# Patient Record
Sex: Female | Born: 1955
Health system: Southern US, Community
[De-identification: ages and names within clinical notes are randomized; demographics above are authoritative.]

## PROBLEM LIST (undated history)

## (undated) DIAGNOSIS — E8881 Metabolic syndrome: Secondary | ICD-10-CM

## (undated) DIAGNOSIS — E785 Hyperlipidemia, unspecified: Secondary | ICD-10-CM

## (undated) DIAGNOSIS — F1290 Cannabis use, unspecified, uncomplicated: Secondary | ICD-10-CM

## (undated) DIAGNOSIS — R4681 Obsessive-compulsive behavior: Secondary | ICD-10-CM

## (undated) DIAGNOSIS — M1990 Unspecified osteoarthritis, unspecified site: Secondary | ICD-10-CM

## (undated) DIAGNOSIS — R233 Spontaneous ecchymoses: Secondary | ICD-10-CM

## (undated) DIAGNOSIS — R238 Other skin changes: Secondary | ICD-10-CM

## (undated) DIAGNOSIS — R011 Cardiac murmur, unspecified: Secondary | ICD-10-CM

## (undated) DIAGNOSIS — N950 Postmenopausal bleeding: Secondary | ICD-10-CM

## (undated) DIAGNOSIS — E669 Obesity, unspecified: Secondary | ICD-10-CM

## (undated) DIAGNOSIS — R519 Headache, unspecified: Secondary | ICD-10-CM

## (undated) DIAGNOSIS — E119 Type 2 diabetes mellitus without complications: Secondary | ICD-10-CM

## (undated) DIAGNOSIS — H269 Unspecified cataract: Secondary | ICD-10-CM

## (undated) DIAGNOSIS — E039 Hypothyroidism, unspecified: Secondary | ICD-10-CM

## (undated) DIAGNOSIS — R51 Headache: Secondary | ICD-10-CM

## (undated) DIAGNOSIS — R030 Elevated blood-pressure reading, without diagnosis of hypertension: Secondary | ICD-10-CM

## (undated) DIAGNOSIS — F410 Panic disorder [episodic paroxysmal anxiety] without agoraphobia: Secondary | ICD-10-CM

## (undated) HISTORY — DX: Elevated blood-pressure reading, without diagnosis of hypertension: R03.0

## (undated) HISTORY — DX: Metabolic syndrome: E88.81

## (undated) HISTORY — PX: COLONOSCOPY: SHX174

## (undated) HISTORY — DX: Type 2 diabetes mellitus without complications: E11.9

## (undated) HISTORY — DX: Cannabis use, unspecified, uncomplicated: F12.90

## (undated) HISTORY — DX: Unspecified cataract: H26.9

## (undated) HISTORY — DX: Unspecified osteoarthritis, unspecified site: M19.90

## (undated) HISTORY — DX: Metabolic syndrome: E88.810

## (undated) HISTORY — DX: Cardiac murmur, unspecified: R01.1

## (undated) HISTORY — DX: Hyperlipidemia, unspecified: E78.5

## (undated) HISTORY — DX: Postmenopausal bleeding: N95.0

## (undated) HISTORY — PX: NO PAST SURGERIES: SHX2092

## (undated) HISTORY — DX: Panic disorder (episodic paroxysmal anxiety): F41.0

## (undated) HISTORY — DX: Hypothyroidism, unspecified: E03.9

## (undated) HISTORY — DX: Obesity, unspecified: E66.9

---

## 2016-07-26 LAB — HM PAP SMEAR: HM PAP: NORMAL

## 2016-08-10 ENCOUNTER — Ambulatory Visit (INDEPENDENT_AMBULATORY_CARE_PROVIDER_SITE_OTHER): Payer: BLUE CROSS/BLUE SHIELD | Admitting: Family Medicine

## 2016-08-10 ENCOUNTER — Encounter: Payer: Self-pay | Admitting: Family Medicine

## 2016-08-10 VITALS — BP 156/76 | HR 78 | Temp 98.6°F | Ht 62.0 in | Wt 292.5 lb

## 2016-08-10 DIAGNOSIS — N950 Postmenopausal bleeding: Secondary | ICD-10-CM

## 2016-08-10 DIAGNOSIS — E119 Type 2 diabetes mellitus without complications: Secondary | ICD-10-CM | POA: Diagnosis not present

## 2016-08-10 DIAGNOSIS — R011 Cardiac murmur, unspecified: Secondary | ICD-10-CM

## 2016-08-10 DIAGNOSIS — E039 Hypothyroidism, unspecified: Secondary | ICD-10-CM | POA: Diagnosis not present

## 2016-08-10 DIAGNOSIS — E785 Hyperlipidemia, unspecified: Secondary | ICD-10-CM

## 2016-08-10 DIAGNOSIS — IMO0001 Reserved for inherently not codable concepts without codable children: Secondary | ICD-10-CM

## 2016-08-10 DIAGNOSIS — F410 Panic disorder [episodic paroxysmal anxiety] without agoraphobia: Secondary | ICD-10-CM | POA: Diagnosis not present

## 2016-08-10 DIAGNOSIS — R030 Elevated blood-pressure reading, without diagnosis of hypertension: Secondary | ICD-10-CM

## 2016-08-10 DIAGNOSIS — F1290 Cannabis use, unspecified, uncomplicated: Secondary | ICD-10-CM

## 2016-08-10 DIAGNOSIS — M1990 Unspecified osteoarthritis, unspecified site: Secondary | ICD-10-CM

## 2016-08-10 DIAGNOSIS — E8881 Metabolic syndrome: Secondary | ICD-10-CM

## 2016-08-10 DIAGNOSIS — E669 Obesity, unspecified: Secondary | ICD-10-CM

## 2016-08-10 DIAGNOSIS — Z0000 Encounter for general adult medical examination without abnormal findings: Secondary | ICD-10-CM

## 2016-08-10 MED ORDER — ALPRAZOLAM 1 MG PO TABS: 0.5000 mg | ORAL_TABLET | Freq: Every day | ORAL | 0 refills | Status: DC | PRN

## 2016-08-10 MED ORDER — LEVOTHYROXINE SODIUM 25 MCG PO TABS: 25.0000 ug | ORAL_TABLET | Freq: Every day | ORAL | Status: DC

## 2016-08-10 NOTE — Patient Instructions (Addendum)
Go to the lab on the way out.  We'll contact you with your lab report. Start levothyroid a day.  Recheck TSH in about 2 months.   Take care.  Glad to see you.  Read up on diabetes.org.  Look up Type 2.   We'll be in touch and make some plans after I see your labs.   Take care.  Glad to see you.

## 2016-08-10 NOTE — Progress Notes (Signed)
New patient.    OA improved on tumeric.  This is a chronic issue for patient, per her report.    Hypothyroidism.  Prev on armour/levothyroxine.  Off med for years.  TSH 19 in 04/2016, [facility].    H/o elevated BP at MD visits but not o/w per patient.  Not on meds o/w.    DM2.  Outside A1c 6.6 reviewed.  D/w pt about DM dx and path phys.  She didn't realize she was diabetic.   HLD.  Noted on outside labs, reviewed.    Panic sx.  Had been on xanax prn over the years.  Not taken daily.  Has gone months w/o need for med.    H/o VB, had f/u with GYN, uterine polyps noted.  F/u pending with possible removal.  She hasn't had a biopsy yet.    Pap and mammogram done per GYN recently.  Outside notes reviewed re: pap.   Ongoing episodic marijuana use.  D/w pt about cessation and finding other ways to manage her anxiety, etc.   Obese, d/w pt about weight loss.   PMH and SH reviewed  ROS: Per HPI unless specifically indicated in ROS section   Meds, vitals, and allergies reviewed.   GEN: nad, alert and oriented, obese HEENT: mucous membranes moist NECK: supple w/o LA, no tmg CV: rrr.  Soft systolic murmur noted at L upper sternal border PULM: ctab, no inc wob ABD: soft, +bs EXT: no edema SKIN: no acute rash

## 2016-08-10 NOTE — Progress Notes (Signed)
Pre visit review using our clinic review tool, if applicable. No additional management support is needed unless otherwise documented below in the visit note. 

## 2016-08-11 ENCOUNTER — Encounter: Payer: Self-pay | Admitting: Family Medicine

## 2016-08-11 DIAGNOSIS — E8881 Metabolic syndrome: Secondary | ICD-10-CM | POA: Insufficient documentation

## 2016-08-11 DIAGNOSIS — N950 Postmenopausal bleeding: Secondary | ICD-10-CM | POA: Insufficient documentation

## 2016-08-11 DIAGNOSIS — Z0000 Encounter for general adult medical examination without abnormal findings: Secondary | ICD-10-CM | POA: Insufficient documentation

## 2016-08-11 DIAGNOSIS — E669 Obesity, unspecified: Secondary | ICD-10-CM | POA: Insufficient documentation

## 2016-08-11 DIAGNOSIS — R011 Cardiac murmur, unspecified: Secondary | ICD-10-CM | POA: Insufficient documentation

## 2016-08-11 DIAGNOSIS — R030 Elevated blood-pressure reading, without diagnosis of hypertension: Secondary | ICD-10-CM | POA: Insufficient documentation

## 2016-08-11 DIAGNOSIS — E119 Type 2 diabetes mellitus without complications: Secondary | ICD-10-CM | POA: Insufficient documentation

## 2016-08-11 DIAGNOSIS — F1290 Cannabis use, unspecified, uncomplicated: Secondary | ICD-10-CM | POA: Insufficient documentation

## 2016-08-11 DIAGNOSIS — F410 Panic disorder [episodic paroxysmal anxiety] without agoraphobia: Secondary | ICD-10-CM | POA: Insufficient documentation

## 2016-08-11 DIAGNOSIS — E039 Hypothyroidism, unspecified: Secondary | ICD-10-CM | POA: Insufficient documentation

## 2016-08-11 DIAGNOSIS — E785 Hyperlipidemia, unspecified: Secondary | ICD-10-CM | POA: Insufficient documentation

## 2016-08-11 DIAGNOSIS — M1990 Unspecified osteoarthritis, unspecified site: Secondary | ICD-10-CM | POA: Insufficient documentation

## 2016-08-11 LAB — TSH: TSH: 13.62 u[IU]/mL — AB (ref 0.35–4.50)

## 2016-08-11 LAB — HEMOGLOBIN A1C: HEMOGLOBIN A1C: 6.8 % — AB (ref 4.6–6.5)

## 2016-08-11 NOTE — Assessment & Plan Note (Signed)
Lifelong per patient report. We can follow this clinically. She does not appear to need an echo at this point.

## 2016-08-11 NOTE — Assessment & Plan Note (Addendum)
A1c 6.6 previously. She is diabetic. Discussed with patient about pathophysiology of diabetes. She said she wasn't even aware that she was diabetic. We talked about diet and exercise and weight loss. Recheck A1c pending. We will make plans when I see her follow-up A1c. >45 minutes spent in face to face time with patient, >50% spent in counselling or coordination of care.

## 2016-08-11 NOTE — Assessment & Plan Note (Signed)
She says that she has whitecoat hypertension and her blood pressure is controlled out of clinic. We can follow this episodically. Still needs weight loss.

## 2016-08-11 NOTE — Assessment & Plan Note (Signed)
Discussed with patient. Cessation encouraged. She needs to stop use. See discussion of panic.

## 2016-08-11 NOTE — Assessment & Plan Note (Signed)
She has uterine biopsy pending. I will defer to gynecology.

## 2016-08-11 NOTE — Assessment & Plan Note (Signed)
Noted on outside labs. We can recheck this again later on. Need to work on diet and exercise.

## 2016-08-11 NOTE — Assessment & Plan Note (Signed)
Likely exacerbated by her weight. She takes tumeric for this.

## 2016-08-11 NOTE — Assessment & Plan Note (Signed)
Needs diet and exercise.

## 2016-08-11 NOTE — Assessment & Plan Note (Signed)
She has been told she had a thyroid problem but didn't really understand what was going on. I told her about the pathophysiology of hypothyroidism. Needs to start 25 g levothyroxine daily. Routine instructions given. Recheck TSH in about 2 months. She only had a prescription for the medication, but had not started yet. All questions answered.

## 2016-08-11 NOTE — Assessment & Plan Note (Signed)
She is occasionally used Xanax on a when necessary basis. Discussed with patient. Not a good long term medication. She has an upcoming uterine biopsy and she is really anxious about that. I gave her short course of Xanax, #15, to use on a when necessary basis to get through this procedure. I told her about routine control medicine monitoring and future drug testing. She needs to decide how she was to manage her anxiety. There is no expectation for me to continue to prescribe controlled medicines if she is going to use illicit drugs.  I talked with her about other preventive medications that are legal. This is her chance to stop using marijuana and decide how she wants to handle her panic symptoms.

## 2016-08-11 NOTE — Assessment & Plan Note (Signed)
Discussed with patient about her health in general. "I thought I was healthy." I told her about the fact that she has multiple overlapping chronic illnesses that need treatment.   We talked about health maintenance in general.  She told me that she did not believe in vaccinations. I told her that her beliefs about vaccination didn't matter to me.  What really matters to me is giving her an opportunity to get access to good healthcare. She is going to have to decide if she is willing to engage in that. In her case she has multiple indications and no contraindications.

## 2016-08-11 NOTE — Assessment & Plan Note (Signed)
Needs diet and exercise. 

## 2016-08-12 ENCOUNTER — Encounter: Payer: Self-pay | Admitting: Family Medicine

## 2016-10-05 ENCOUNTER — Other Ambulatory Visit: Payer: Self-pay | Admitting: Obstetrics and Gynecology

## 2016-10-08 NOTE — Patient Instructions (Signed)
Your procedure is scheduled on:  Tuesday, Jan. 2, 2017  Enter through the Main Entrance of [facility] at:  11:30 AM  Pick up the phone at the desk and dial [phone].  Call this number if you have problems the morning of surgery: [phone].  Remember: Do NOT eat food:  After Midnight Monday, Jan. 1, 2017  Do NOT drink clear liquids after:  9:00 AM day of surgery  Take these medicines the morning of surgery with a SIP OF WATER:  Levothyroxine, Xanax if needed  Stop ALL herbal medications and turmeric at this time   Do NOT wear jewelry (body piercing), metal hair clips/bobby pins, make-up, or nail polish. Do NOT wear lotions, powders, or perfumes.  You may wear deodorant. Do NOT shave for 48 hours prior to surgery. Do NOT bring valuables to the hospital. Contacts, dentures, or bridgework may not be worn into surgery.  Have a responsible adult drive you home and stay with you for 24 hours after your procedure

## 2016-10-12 ENCOUNTER — Encounter (HOSPITAL_COMMUNITY): Payer: Self-pay

## 2016-10-12 ENCOUNTER — Other Ambulatory Visit: Payer: Self-pay

## 2016-10-12 ENCOUNTER — Encounter (HOSPITAL_COMMUNITY)
Admission: RE | Admit: 2016-10-12 | Discharge: 2016-10-12 | Disposition: A | Discharge status: Home / Self Care | Payer: BLUE CROSS/BLUE SHIELD | Source: Ambulatory Visit | Attending: Obstetrics and Gynecology | Admitting: Obstetrics and Gynecology

## 2016-10-12 DIAGNOSIS — Z01810 Encounter for preprocedural cardiovascular examination: Secondary | ICD-10-CM | POA: Diagnosis present

## 2016-10-12 DIAGNOSIS — Q248 Other specified congenital malformations of heart: Secondary | ICD-10-CM | POA: Diagnosis not present

## 2016-10-12 DIAGNOSIS — E785 Hyperlipidemia, unspecified: Secondary | ICD-10-CM | POA: Insufficient documentation

## 2016-10-12 DIAGNOSIS — M1990 Unspecified osteoarthritis, unspecified site: Secondary | ICD-10-CM | POA: Insufficient documentation

## 2016-10-12 DIAGNOSIS — R011 Cardiac murmur, unspecified: Secondary | ICD-10-CM | POA: Diagnosis not present

## 2016-10-12 DIAGNOSIS — Z01812 Encounter for preprocedural laboratory examination: Secondary | ICD-10-CM | POA: Diagnosis not present

## 2016-10-12 DIAGNOSIS — R030 Elevated blood-pressure reading, without diagnosis of hypertension: Secondary | ICD-10-CM | POA: Insufficient documentation

## 2016-10-12 DIAGNOSIS — E8881 Metabolic syndrome: Secondary | ICD-10-CM | POA: Insufficient documentation

## 2016-10-12 DIAGNOSIS — E039 Hypothyroidism, unspecified: Secondary | ICD-10-CM | POA: Diagnosis not present

## 2016-10-12 DIAGNOSIS — E669 Obesity, unspecified: Secondary | ICD-10-CM | POA: Insufficient documentation

## 2016-10-12 DIAGNOSIS — R7303 Prediabetes: Secondary | ICD-10-CM | POA: Diagnosis not present

## 2016-10-12 DIAGNOSIS — F1290 Cannabis use, unspecified, uncomplicated: Secondary | ICD-10-CM | POA: Insufficient documentation

## 2016-10-12 DIAGNOSIS — F410 Panic disorder [episodic paroxysmal anxiety] without agoraphobia: Secondary | ICD-10-CM | POA: Insufficient documentation

## 2016-10-12 DIAGNOSIS — N950 Postmenopausal bleeding: Secondary | ICD-10-CM | POA: Diagnosis not present

## 2016-10-12 HISTORY — DX: Obsessive-compulsive behavior: R46.81

## 2016-10-12 HISTORY — DX: Headache: R51

## 2016-10-12 HISTORY — DX: Other skin changes: R23.8

## 2016-10-12 HISTORY — DX: Headache, unspecified: R51.9

## 2016-10-12 HISTORY — DX: Spontaneous ecchymoses: R23.3

## 2016-10-12 LAB — CBC
HCT: 45.7 % (ref 36.0–46.0)
Hemoglobin: 15.3 g/dL — ABNORMAL HIGH (ref 12.0–15.0)
MCH: 33 pg (ref 26.0–34.0)
MCHC: 33.5 g/dL (ref 30.0–36.0)
MCV: 98.7 fL (ref 78.0–100.0)
Platelets: 221 10*3/uL (ref 150–400)
RBC: 4.63 MIL/uL (ref 3.87–5.11)
RDW: 14.4 % (ref 11.5–15.5)
WBC: 7.6 10*3/uL (ref 4.0–10.5)

## 2016-10-19 ENCOUNTER — Encounter (HOSPITAL_COMMUNITY): Payer: Self-pay

## 2016-10-19 ENCOUNTER — Ambulatory Visit (HOSPITAL_COMMUNITY)
Admission: RE | Admit: 2016-10-19 | Discharge: 2016-10-19 | Disposition: A | Discharge status: Home / Self Care | Payer: BLUE CROSS/BLUE SHIELD | Source: Ambulatory Visit | Attending: Obstetrics and Gynecology | Admitting: Obstetrics and Gynecology

## 2016-10-19 ENCOUNTER — Ambulatory Visit (HOSPITAL_COMMUNITY): Payer: BLUE CROSS/BLUE SHIELD | Admitting: Anesthesiology

## 2016-10-19 ENCOUNTER — Encounter (HOSPITAL_COMMUNITY)
Admission: RE | Disposition: A | Discharge status: Home / Self Care | Payer: Self-pay | Source: Ambulatory Visit | Attending: Obstetrics and Gynecology

## 2016-10-19 ENCOUNTER — Other Ambulatory Visit: Payer: BLUE CROSS/BLUE SHIELD

## 2016-10-19 DIAGNOSIS — E669 Obesity, unspecified: Secondary | ICD-10-CM | POA: Insufficient documentation

## 2016-10-19 DIAGNOSIS — N841 Polyp of cervix uteri: Secondary | ICD-10-CM | POA: Diagnosis not present

## 2016-10-19 DIAGNOSIS — N939 Abnormal uterine and vaginal bleeding, unspecified: Secondary | ICD-10-CM | POA: Insufficient documentation

## 2016-10-19 DIAGNOSIS — E785 Hyperlipidemia, unspecified: Secondary | ICD-10-CM | POA: Diagnosis not present

## 2016-10-19 DIAGNOSIS — R7303 Prediabetes: Secondary | ICD-10-CM | POA: Diagnosis not present

## 2016-10-19 DIAGNOSIS — Z79899 Other long term (current) drug therapy: Secondary | ICD-10-CM | POA: Diagnosis not present

## 2016-10-19 DIAGNOSIS — E039 Hypothyroidism, unspecified: Secondary | ICD-10-CM | POA: Diagnosis not present

## 2016-10-19 HISTORY — PX: DILATATION & CURETTAGE/HYSTEROSCOPY WITH MYOSURE: SHX6511

## 2016-10-19 SURGERY — DILATATION & CURETTAGE/HYSTEROSCOPY WITH MYOSURE
Anesthesia: Spinal

## 2016-10-19 MED ORDER — ONDANSETRON HCL 4 MG/2ML IJ SOLN
INTRAMUSCULAR | Status: AC
  Filled 2016-10-19: qty 2

## 2016-10-19 MED ORDER — LACTATED RINGERS IV SOLN
INTRAVENOUS | Status: DC
  Administered 2016-10-19: 125 mL/h via INTRAVENOUS
  Administered 2016-10-19: 14:00:00 via INTRAVENOUS

## 2016-10-19 MED ORDER — ONDANSETRON HCL 4 MG/2ML IJ SOLN
INTRAMUSCULAR | Status: DC | PRN
  Administered 2016-10-19: 4 mg via INTRAVENOUS

## 2016-10-19 MED ORDER — SODIUM CHLORIDE 0.9 % IJ SOLN
INTRAMUSCULAR | Status: AC
  Filled 2016-10-19: qty 10

## 2016-10-19 MED ORDER — ACETAMINOPHEN 160 MG/5ML PO SOLN
975.0000 mg | Freq: Once | ORAL | Status: AC
  Administered 2016-10-19: 975 mg via ORAL

## 2016-10-19 MED ORDER — VASOPRESSIN 20 UNIT/ML IV SOLN
INTRAVENOUS | Status: AC
  Filled 2016-10-19: qty 1

## 2016-10-19 MED ORDER — SODIUM CHLORIDE 0.9 % IJ SOLN: INTRAMUSCULAR | Status: DC | PRN

## 2016-10-19 MED ORDER — SCOPOLAMINE 1 MG/3DAYS TD PT72
1.0000 | MEDICATED_PATCH | TRANSDERMAL | Status: DC
  Administered 2016-10-19: 1.5 mg via TRANSDERMAL

## 2016-10-19 MED ORDER — BUPIVACAINE HCL (PF) 0.25 % IJ SOLN
INTRAMUSCULAR | Status: DC | PRN
  Administered 2016-10-19: 20 mL

## 2016-10-19 MED ORDER — DEXTROSE 5 % IV SOLN
3.0000 g | INTRAVENOUS | Status: DC
  Filled 2016-10-19: qty 3000

## 2016-10-19 MED ORDER — SODIUM CHLORIDE 0.9 % IR SOLN
Status: DC | PRN
  Administered 2016-10-19: 2487 mL

## 2016-10-19 MED ORDER — BUPIVACAINE HCL (PF) 0.25 % IJ SOLN
INTRAMUSCULAR | Status: AC
  Filled 2016-10-19: qty 30

## 2016-10-19 MED ORDER — SODIUM CHLORIDE 0.9 % IJ SOLN
INTRAMUSCULAR | Status: AC
  Filled 2016-10-19: qty 40

## 2016-10-19 MED ORDER — FENTANYL CITRATE (PF) 100 MCG/2ML IJ SOLN
INTRAMUSCULAR | Status: AC
  Filled 2016-10-19: qty 2

## 2016-10-19 MED ORDER — SCOPOLAMINE 1 MG/3DAYS TD PT72
MEDICATED_PATCH | TRANSDERMAL | Status: DC
Start: 2016-10-19 — End: 2016-10-19
  Administered 2016-10-19: 1.5 mg via TRANSDERMAL
  Filled 2016-10-19: qty 1

## 2016-10-19 MED ORDER — ACETAMINOPHEN 160 MG/5ML PO SOLN
ORAL | Status: AC
  Administered 2016-10-19: 975 mg via ORAL
  Filled 2016-10-19: qty 40.6

## 2016-10-19 MED ORDER — DEXTROSE 5 % IV SOLN
INTRAVENOUS | Status: DC | PRN
  Administered 2016-10-19: 3 g via INTRAVENOUS

## 2016-10-19 MED ORDER — VASOPRESSIN 20 UNIT/ML IV SOLN
INTRAVENOUS | Status: DC | PRN
  Administered 2016-10-19: 20 mL via INTRAMUSCULAR

## 2016-10-19 MED ORDER — CEFAZOLIN SODIUM-DEXTROSE 2-4 GM/100ML-% IV SOLN: 2.0000 g | INTRAVENOUS | Status: DC

## 2016-10-19 MED ORDER — TRAMADOL HCL 50 MG PO TABS: 50.0000 mg | ORAL_TABLET | Freq: Four times a day (QID) | ORAL | 0 refills | Status: DC | PRN

## 2016-10-19 SURGICAL SUPPLY — 25 items
CANISTER SUCT 3000ML (MISCELLANEOUS) ×2 IMPLANT
CATH FOLEY 2WAY  3CC  8FR (CATHETERS) ×1
CATH FOLEY 2WAY 3CC 8FR (CATHETERS) ×1 IMPLANT
CATH ROBINSON RED A/P 16FR (CATHETERS) IMPLANT
CLOTH BEACON ORANGE TIMEOUT ST (SAFETY) ×2 IMPLANT
CONTAINER PREFILL 10% NBF 60ML (FORM) ×4 IMPLANT
DECANTER SPIKE VIAL GLASS SM (MISCELLANEOUS) ×4 IMPLANT
DEVICE MYOSURE LITE (MISCELLANEOUS) ×2 IMPLANT
DEVICE MYOSURE REACH (MISCELLANEOUS) IMPLANT
FILTER ARTHROSCOPY CONVERTOR (FILTER) ×2 IMPLANT
GLOVE BIO SURGEON STRL SZ7.5 (GLOVE) ×2 IMPLANT
GLOVE BIOGEL PI IND STRL 7.0 (GLOVE) ×1 IMPLANT
GLOVE BIOGEL PI INDICATOR 7.0 (GLOVE) ×1
GOWN STRL REUS W/TWL LRG LVL3 (GOWN DISPOSABLE) ×4 IMPLANT
NEEDLE SPNL 22GX3.5 QUINCKE BK (NEEDLE) ×2 IMPLANT
PACK VAGINAL MINOR WOMEN LF (CUSTOM PROCEDURE TRAY) ×2 IMPLANT
PAD OB MATERNITY 4.3X12.25 (PERSONAL CARE ITEMS) ×2 IMPLANT
SEAL ROD LENS SCOPE MYOSURE (ABLATOR) ×2 IMPLANT
SYR CONTROL 10ML LL (SYRINGE) ×2 IMPLANT
SYR TB 1ML 25GX5/8 (SYRINGE) ×2 IMPLANT
TOWEL OR 17X24 6PK STRL BLUE (TOWEL DISPOSABLE) ×4 IMPLANT
TRAY FOLEY CATH SILVER 14FR (SET/KITS/TRAYS/PACK) ×2 IMPLANT
TUBING AQUILEX INFLOW (TUBING) ×2 IMPLANT
TUBING AQUILEX OUTFLOW (TUBING) ×2 IMPLANT
WATER STERILE IRR 1000ML POUR (IV SOLUTION) ×2 IMPLANT

## 2016-10-19 NOTE — Op Note (Signed)
10/19/2016  2:15 PM  PATIENT:  Cassidy Russell  61 y.o. female  PRE-OPERATIVE DIAGNOSIS:  Abnormal Uterine Bleeding-PMB  POST-OPERATIVE DIAGNOSIS:  Abnormal Uterine Bleeding-PMB  Large endocervical polyp  PROCEDURE:  Procedure(s): DILATATION & CURETTAGE/HYSTEROSCOPY WITH MYOSURE PLACEMENT OF INTRAUTERINE FOLEY CATHETER  SURGEON:  Surgeon(s): Olivia Mackieichard Natalia Wittmeyer, MD  ASSISTANTS: none   ANESTHESIA:   local and spinal  ESTIMATED BLOOD LOSS: MINIMAL  DRAINS: Urinary Catheter (Foley)   LOCAL MEDICATIONS USED:  MARCAINE    and Amount: 20 ml  SPECIMEN:  Source of Specimen:  EMC, POLYP  DISPOSITION OF SPECIMEN:  PATHOLOGY  COUNTS:  YES  DICTATION #: [id number]  PLAN OF CARE: DC HOME  PATIENT DISPOSITION:  PACU - hemodynamically stable.

## 2016-10-19 NOTE — Op Note (Signed)
NAMLetitia Russell:  Cassidy Russell, Cassidy Russell                   ACCOUNT NO.:  [id number]  MEDICAL RECORD NO.:  [id number]  LOCATION:                                 FACILITY:  PHYSICIAN:  Lenoard Adenichard J. Corde Antonini, M.D.     DATE OF BIRTH:  DATE OF PROCEDURE:  10/19/2016 DATE OF DISCHARGE:                              OPERATIVE REPORT   PREOPERATIVE DIAGNOSIS:  Postmenopausal bleeding.  POSTOPERATIVE DIAGNOSIS:  Postmenopausal bleeding with a large endocervical polyp.  PROCEDURE:  Diagnostic hysteroscopy, dilation and curettage, MyoSure with placement of intrauterine pediatric Foley catheter for bleeding from polyp removal site.  SURGEON:  Lenoard Adenichard J. Mishael Haran, M.D.  ASSISTANT:  None.  ANESTHESIA:  Spinal and local.  ESTIMATED BLOOD LOSS:  Less than 50 mL.  COMPLICATIONS:  None.  DRAINS:  A pediatric Foley intrauterine and bladder catheter.  COUNTS:  Correct.  DISPOSITION:  The patient to recovery in good condition.  BRIEF OPERATIVE NOTE:  After being apprised of risks of anesthesia, infection, bleeding in surrounding organs, possible need for repair, delayed versus immediate complications to include bowel and bladder injury, possible need for repair, the patient was brought to the operating room where she was administered spinal anesthetic without complications, prepped and draped in usual sterile fashion, and Foley catheter placed.  Exam under anesthesia reveals an anteflexed uterus with no adnexal masses.  At this time, the speculum was placed, dilute Marcaine solution placed in standard paracervical block, dilute Pitressin solution placed at 3 and 9 o'clock.  A hysteroscope was placed after dilatation up to a #23 Pratt dilator.  Visualization revealed an endocervical polyp or what appears to be a long elongated endocervical polyp which was obstructing the view to the uterus due to its occupance of the entire endocervical canal.  The MyoSure was then placed and used to resect this polyp in total up to  its level of attachment at the internal os.  There was some evidence of bleeding from the site, which was difficult to visualize.  The endometrial curettings were also collected using sharp curettage in a 4-quadrant method and sent for pathological confirmation.  Due to a steady trickle of blood without heavy bleeding, pediatric Foley was placed without difficulty.  At this point, minimal bleeding was noted. Pediatric Foley will be removed in the recovery room.  The patient tolerated the procedure well and was transferred to recovery in good condition.  Specimen to pathology.  The patient to recovery in good condition.     Lenoard Adenichard J. Darica Goren, M.D.     RJT/MEDQ  D:  10/19/2016  T:  10/19/2016  Job:  [id number]

## 2016-10-19 NOTE — Anesthesia Preprocedure Evaluation (Addendum)
Anesthesia Evaluation  Patient identified by MRN, date of birth, ID band Patient awake    Reviewed: Allergy & Precautions, H&P , Patient's Chart, lab work & pertinent test results  Airway Mallampati: II  TM Distance: >3 FB Neck ROM: full    Dental no notable dental hx.    Pulmonary    Pulmonary exam normal breath sounds clear to auscultation       Cardiovascular Exercise Tolerance: Good  Rhythm:regular Rate:Normal     Neuro/Psych    GI/Hepatic   Endo/Other  Morbid obesity  Renal/GU      Musculoskeletal   Abdominal   Peds  Hematology   Anesthesia Other Findings No previoius hx of GA or regional Will use LA only; NV will not be an aissue  Reproductive/Obstetrics                            Anesthesia Physical Anesthesia Plan  ASA: III  Anesthesia Plan: Spinal   Post-op Pain Management:    Induction:   Airway Management Planned:   Additional Equipment:   Intra-op Plan:   Post-operative Plan:   Informed Consent: I have reviewed the patients History and Physical, chart, labs and discussed the procedure including the risks, benefits and alternatives for the proposed anesthesia with the patient or authorized representative who has indicated his/her understanding and acceptance.   Dental Advisory Given  Plan Discussed with: CRNA  Anesthesia Plan Comments: (Lab work and procedure  confirmed with CRNA in room; platelets okay. Discussed spinal anesthetic, and patient consents to the procedure:  included risk of possible headache,backache, failed block, allergic reaction, and nerve injury. This patient was asked if she had any questions or concerns before the procedure started.  )        Anesthesia Quick Evaluation

## 2016-10-19 NOTE — Transfer of Care (Signed)
Immediate Anesthesia Transfer of Care Note  Patient: Cassidy Russell  Procedure(s) Performed: Procedure(s): DILATATION & CURETTAGE/HYSTEROSCOPY WITH MYOSURE (N/A)  Patient Location: PACU  Anesthesia Type:Spinal  Level of Consciousness: awake, alert , oriented and patient cooperative  Airway & Oxygen Therapy: Patient Spontanous Breathing  Post-op Assessment: Report given to RN and Post -op Vital signs reviewed and stable  Post vital signs: Reviewed and stable  Last Vitals:  Vitals:   10/19/16 1414 10/19/16 1415  BP: (!) 151/69 (!) 151/69  Pulse: (!) 55 (!) 58  Resp: 12 12  Temp: 36.8 C     Last Pain:  Vitals:   10/19/16 1202  TempSrc: Oral      Patients Stated Pain Goal: 4 (10/19/16 1202)  Complications: No apparent anesthesia complications

## 2016-10-19 NOTE — H&P (Signed)
Cassidy Russell is an 61 y.o. female for PMB  Pertinent Gynecological History: Menses: post-menopausal Bleeding: post menopausal bleeding Contraception: none DES exposure: denies Blood transfusions: none Sexually transmitted diseases: no past history Previous GYN Procedures: DNC  Last mammogram: normal Date: 2017 Last pap: normal Date: 2017 OB History: G1, P0   Menstrual History: Menarche age: [age] No LMP recorded. Patient is postmenopausal.    Past Medical History:  Diagnosis Date  . Arthritis   . Bruises easily   . Elevated BP without diagnosis of hypertension   . Headache    Migraines  . Heart murmur    lifelong per patient report  . Hyperlipidemia   . Hypothyroidism   . Marijuana use   . Metabolic syndrome   . Obesity   . Obsessive-compulsive behavior   . Panic   . Postmenopausal vaginal bleeding   . Pre-diabetes     Past Surgical History:  Procedure Laterality Date  . COLONOSCOPY    . NO PAST SURGERIES      Family History  Problem Relation Age of Onset  . Adopted: Yes    Social History:  reports that she has never smoked. She has never used smokeless tobacco. She reports that she drinks about 1.2 oz of alcohol per week . She reports that she uses drugs, including Marijuana.  Allergies:  Allergies  Allergen Reactions  . Iodine Anaphylaxis and Itching  . Shellfish Allergy Anaphylaxis and Itching  . Barium-Containing Compounds     Prescriptions Prior to Admission  Medication Sig Dispense Refill Last Dose  . ALPRAZolam (XANAX) 1 MG tablet Take 0.5 tablets (0.5 mg total) by mouth daily as needed for anxiety. 15 tablet 0 10/18/2016 at 1000  . levothyroxine (SYNTHROID, LEVOTHROID) 25 MCG tablet Take 1 tablet (25 mcg total) by mouth daily before breakfast.   Past Week at unknown  . TURMERIC PO Take 5 mLs by mouth at bedtime. Pt mixes with 1/4 teaspoon of cinnamon, a cup and a half of soy milk, and 1/2 teaspoon of black pepper.   Past Month at unknown    Review of  Systems  Constitutional: Negative.   All other systems reviewed and are negative.   Blood pressure (!) 147/76, pulse 60, temperature 98.6 F (37 C), temperature source Oral, resp. rate 16, SpO2 98 %. Physical Exam  Nursing note and vitals reviewed. Constitutional: She is oriented to person, place, and time. She appears well-developed and well-nourished.  HENT:  Head: Normocephalic and atraumatic.  Neck: Normal range of motion. Neck supple. No thyromegaly present.  Cardiovascular: Normal rate and regular rhythm.   Respiratory: Effort normal and breath sounds normal.  GI: Bowel sounds are normal.  Genitourinary: Vagina normal and uterus normal.  Musculoskeletal: Normal range of motion.  Neurological: She is alert and oriented to person, place, and time.  Skin: Skin is warm and dry.  Psychiatric: She has a normal mood and affect.    No results found for this or any previous visit (from the past 24 hour(s)).  No results found.  Assessment/Plan: PMB Structural lesion Diag HS, D&C with Myosure Consent done.  Khyle Goodell J 10/19/2016, 1:02 PM

## 2016-10-19 NOTE — Discharge Instructions (Signed)

## 2016-10-19 NOTE — Progress Notes (Signed)
Patient seen and examined. Consent witnessed and signed. No changes noted. Update completed.Patient ID: Cassidy KaufmannKay Fiorella, female   DOB: [birth year], 61 y.o.   MRN: [id number]

## 2016-10-19 NOTE — Anesthesia Postprocedure Evaluation (Signed)
Anesthesia Post Note  Patient: Cassidy Russell  Procedure(s) Performed: Procedure(s) (LRB): DILATATION & CURETTAGE/HYSTEROSCOPY WITH MYOSURE (N/A)  Patient location during evaluation: PACU Anesthesia Type: Spinal Level of consciousness: awake Pain management: satisfactory to patient Vital Signs Assessment: post-procedure vital signs reviewed and stable Respiratory status: spontaneous breathing Cardiovascular status: blood pressure returned to baseline Postop Assessment: no headache and spinal receding Anesthetic complications: no        Last Vitals:  Vitals:   10/19/16 1700 10/19/16 1715  BP: 136/71 129/72  Pulse: (!) 56 (!) 56  Resp: 18 13  Temp:  36.7 C    Last Pain:  Vitals:   10/19/16 1735  TempSrc:   PainSc: 0-No pain   Pain Goal: Patients Stated Pain Goal: 4 (10/19/16 1715)               Cristela BlueJACKSON,Jenese Mischke EDWARD

## 2016-10-19 NOTE — Anesthesia Procedure Notes (Signed)
Spinal  Patient location during procedure: OR Staffing Anesthesiologist: Cristela BlueJACKSON, Lanice Folden Preanesthetic Checklist Completed: patient identified, site marked, surgical consent, pre-op evaluation, timeout performed, IV checked, risks and benefits discussed and monitors and equipment checked Spinal Block Patient position: sitting Prep: DuraPrep Patient monitoring: heart rate, cardiac monitor, continuous pulse ox and blood pressure Approach: midline Location: L3-4 Injection technique: single-shot Needle Needle type: Sprotte  Needle gauge: 24 G Needle length: 12.7 cm Assessment Sensory level: T4 Additional Notes LOR with 17 GA Tuohy at 9cm Sprotte thru touhy 1.2 cc Bupiv .75%

## 2016-10-20 ENCOUNTER — Encounter (HOSPITAL_COMMUNITY): Payer: Self-pay | Admitting: Obstetrics and Gynecology

## 2016-10-20 NOTE — Addendum Note (Signed)
Addendum  created 10/20/16 0749 by Algis GreenhouseLinda A Lillar Bianca, CRNA   Charge Capture section accepted

## 2016-10-20 NOTE — Addendum Note (Signed)
Addendum  created 10/20/16 [id number] by Algis GreenhouseLinda A Helton Oleson, CRNA   Charge Capture section accepted

## 2016-12-14 ENCOUNTER — Ambulatory Visit: Payer: BLUE CROSS/BLUE SHIELD | Admitting: Family Medicine

## 2017-04-11 ENCOUNTER — Other Ambulatory Visit: Payer: Self-pay | Admitting: Family Medicine

## 2017-04-11 NOTE — Telephone Encounter (Signed)
Electronic refill request. Alprazolam Last office visit:   08/10/16 Last Filled:    15 tablet 0 08/10/2016  Please advise.

## 2017-04-12 NOTE — Telephone Encounter (Signed)
1. Needs f/u labs re: hypothyroidism and DM2.  2. Would need OV scheduled thereafter.  3. Needs UDS done before any controlled meds are filled.   I didn't fill rx yet.   Thanks.

## 2017-04-12 NOTE — Telephone Encounter (Signed)
Patient requested appt ASAP in order to get her medication filled.  Appt scheduled with same day labs and UDS.

## 2017-04-14 ENCOUNTER — Ambulatory Visit (INDEPENDENT_AMBULATORY_CARE_PROVIDER_SITE_OTHER): Payer: BLUE CROSS/BLUE SHIELD | Admitting: Family Medicine

## 2017-04-14 ENCOUNTER — Encounter: Payer: Self-pay | Admitting: Family Medicine

## 2017-04-14 VITALS — BP 148/92 | HR 84 | Temp 98.5°F | Wt 289.5 lb

## 2017-04-14 DIAGNOSIS — IMO0001 Reserved for inherently not codable concepts without codable children: Secondary | ICD-10-CM

## 2017-04-14 DIAGNOSIS — E669 Obesity, unspecified: Secondary | ICD-10-CM

## 2017-04-14 DIAGNOSIS — Z0000 Encounter for general adult medical examination without abnormal findings: Secondary | ICD-10-CM

## 2017-04-14 DIAGNOSIS — R030 Elevated blood-pressure reading, without diagnosis of hypertension: Secondary | ICD-10-CM | POA: Diagnosis not present

## 2017-04-14 DIAGNOSIS — E039 Hypothyroidism, unspecified: Secondary | ICD-10-CM | POA: Diagnosis not present

## 2017-04-14 DIAGNOSIS — E119 Type 2 diabetes mellitus without complications: Secondary | ICD-10-CM | POA: Diagnosis not present

## 2017-04-14 DIAGNOSIS — F410 Panic disorder [episodic paroxysmal anxiety] without agoraphobia: Secondary | ICD-10-CM

## 2017-04-14 DIAGNOSIS — R221 Localized swelling, mass and lump, neck: Secondary | ICD-10-CM | POA: Diagnosis not present

## 2017-04-14 LAB — LIPID PANEL
Cholesterol: 258 mg/dL — ABNORMAL HIGH (ref 0–200)
HDL: 64.4 mg/dL (ref >39.00)
NonHDL: 193.34
Total CHOL/HDL Ratio: 4
Triglycerides: 225 mg/dL — ABNORMAL HIGH (ref 0.0–149.0)
VLDL: 45 mg/dL — AB (ref 0.0–40.0)

## 2017-04-14 LAB — COMPREHENSIVE METABOLIC PANEL
ALBUMIN: 4 g/dL (ref 3.5–5.2)
ALK PHOS: 58 U/L (ref 39–117)
ALT: 26 U/L (ref 0–35)
AST: 30 U/L (ref 0–37)
BUN: 11 mg/dL (ref 6–23)
CALCIUM: 9.7 mg/dL (ref 8.4–10.5)
CO2: 29 mEq/L (ref 19–32)
Chloride: 100 mEq/L (ref 96–112)
Creatinine, Ser: 0.76 mg/dL (ref 0.40–1.20)
GFR: 81.88 mL/min (ref >60.00)
Glucose, Bld: 146 mg/dL — ABNORMAL HIGH (ref 70–99)
POTASSIUM: 4.2 meq/L (ref 3.5–5.1)
SODIUM: 139 meq/L (ref 135–145)
TOTAL PROTEIN: 7.6 g/dL (ref 6.0–8.3)
Total Bilirubin: 0.5 mg/dL (ref 0.2–1.2)

## 2017-04-14 LAB — HM DIABETES EYE EXAM

## 2017-04-14 LAB — HEMOGLOBIN A1C: HEMOGLOBIN A1C: 6.9 % — AB (ref 4.6–6.5)

## 2017-04-14 LAB — LDL CHOLESTEROL, DIRECT: LDL DIRECT: 162 mg/dL

## 2017-04-14 LAB — MICROALBUMIN / CREATININE URINE RATIO
Creatinine,U: 165.9 mg/dL
MICROALB UR: 1.3 mg/dL (ref 0.0–1.9)
Microalb Creat Ratio: 0.8 mg/g (ref 0.0–30.0)

## 2017-04-14 LAB — TSH: TSH: 17.18 u[IU]/mL — AB (ref 0.35–4.50)

## 2017-04-14 MED ORDER — HYDROXYZINE HCL 10 MG PO TABS: 10.0000 mg | ORAL_TABLET | Freq: Three times a day (TID) | ORAL | 1 refills | Status: AC | PRN

## 2017-04-14 NOTE — Progress Notes (Signed)
She is trying to get "caught up" on health maintenance.    She had mammogram with a call back but her f/u imaging was negative per patient report.  She'll have yearly screening now.  Then she had D&C with Dr. Billy Coastaavon.   She will get me a copy of her mammogram report.  Her cousin died from melanoma and then her partner had melanoma.    Hypothyroidism.  Due for f/u TSH.  She was off med for 3 days recently but on med o/w.  D/w pt.    She has had an enlarged lymph node versus mass on the R side of the neck.  No midline mass.  She was asking about ENT eval.   discussed with patient. She did not need a referral. Information given to patient.  DM2, due for f/u labs. She has DM2, d/w pt, according to patient she thought she was borderline but she doesn't fact have diabetes. Discussed thoroughly.  Had eye exam recently done, no retinopathy.  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    BP up today, she has checked BP at home and that was normal.  D/w pt about calibrating her cuff at home.    Anxiety. She still smokes pot, she does so to avoid use of BZD.  No point in doing UDS today.   I told her I was not going to prescribe controlled substances if she was actively using any illegal drug.  Declined HCV screening.  No IVDU, no transfusions.   She is low risk per her report. HIV screening prev done ~2000.    PMH and SH reviewed  ROS: Per HPI unless specifically indicated in ROS section   Meds, vitals, and allergies reviewed.   GEN: nad, alert and oriented HEENT: mucous membranes moist NECK: supple w/o LA but she does have a small lump noted on the right posterior side of her neck, this could be a lipoma. It feels rubbery and is not tender. CV: rrr.  no murmur PULM: ctab, no inc wob ABD: soft, +bs EXT: no edema SKIN: no acute rash but tick bite site on the R flank with only local irritation.  Possible lipoma vs cyst on the R posterior neck.    Diabetic foot exam: Normal  inspection No skin breakdown Calluses noted on the bilateral feet, no breakdown. No ulceration. Normal DP pulses Normal sensation to light touch and monofilament Nails normal

## 2017-04-14 NOTE — Patient Instructions (Addendum)
Use the hydroxyzine if needed for anxiety.  Take care.  Glad to see you.  Go to the lab on the way out.  We'll contact you with your lab report. Call [facility]. [phone].   Please send me a copy of your mammogram and eye exam reports.  Take care.  Glad to see you.

## 2017-04-15 DIAGNOSIS — R221 Localized swelling, mass and lump, neck: Secondary | ICD-10-CM | POA: Insufficient documentation

## 2017-04-15 NOTE — Assessment & Plan Note (Signed)
Declined HCV screening.  No IVDU, no transfusions.   She is low risk per her report. HIV screening prev done ~2000.

## 2017-04-15 NOTE — Assessment & Plan Note (Signed)
This is not new, has been present for a while. Not actively enlarging. Not fluctuant and not in need of incision and drainage. Discussed with patient about differential. She wanted to follow-up with ENT. This is reasonable. She does not need referral. Contact information given. It is up to the patient to call ENT since she declined referral.

## 2017-04-15 NOTE — Assessment & Plan Note (Signed)
She still has diabetes, due for follow-up labs. Pathophysiology discussed with patient. Discussed with her about diet and exercise and weight loss. See notes on labs. She has multiple health maintenance issues related to diabetes, discussed with patient. We will try to work on these as we go along. >40 minutes spent in face to face time with patient, >50% spent in counselling or coordination of care.

## 2017-04-15 NOTE — Assessment & Plan Note (Signed)
Discussed with patient about gradual weight loss with diet next her size.

## 2017-04-15 NOTE — Assessment & Plan Note (Signed)
She can calibrate her cuff at home.

## 2017-04-15 NOTE — Assessment & Plan Note (Signed)
I am not going to write for controlled medicines when she is using illegal drugs. Discussed with patient. She can try hydroxyzine. Encouraged her to cease illegal drug use.

## 2017-04-15 NOTE — Assessment & Plan Note (Signed)
See notes on labs. Compliant except for missing a few days recently. No thyromegaly on exam.

## 2017-04-17 ENCOUNTER — Encounter: Payer: Self-pay | Admitting: Family Medicine

## 2017-04-17 ENCOUNTER — Other Ambulatory Visit: Payer: Self-pay | Admitting: Family Medicine

## 2017-04-17 DIAGNOSIS — E119 Type 2 diabetes mellitus without complications: Secondary | ICD-10-CM

## 2017-04-17 DIAGNOSIS — E039 Hypothyroidism, unspecified: Secondary | ICD-10-CM

## 2017-04-17 MED ORDER — LEVOTHYROXINE SODIUM 50 MCG PO TABS: 50.0000 ug | ORAL_TABLET | Freq: Every day | ORAL | 3 refills | Status: DC

## 2017-09-15 ENCOUNTER — Telehealth: Payer: Self-pay | Admitting: Family Medicine

## 2017-09-15 NOTE — Telephone Encounter (Signed)
Flonase is not on patient list- review for Rx

## 2017-09-15 NOTE — Telephone Encounter (Signed)
Copied from CRM [phone]. Topic: Quick Communication - Rx Refill/Question >> Sep 15, 2017 10:30 AM Darletta MollLander, Lumin L wrote: Has the patient contacted their pharmacy? Yes.   (Agent: If no, request that the patient contact the pharmacy for the refill.) Preferred Pharmacy (with phone number or street name): [facility] [phone]#[id number] Agent: Please be advised that RX refills may take up to 48 hours. We ask that you follow-up with your pharmacy.   Para MarchDuncan needs to refill flonase generic

## 2017-09-18 MED ORDER — FLUTICASONE PROPIONATE 50 MCG/ACT NA SUSP: 2.0000 | Freq: Every day | NASAL | 2 refills | Status: DC

## 2017-09-18 NOTE — Telephone Encounter (Signed)
Sent. Thanks.   

## 2018-01-02 ENCOUNTER — Emergency Department: Payer: BLUE CROSS/BLUE SHIELD

## 2018-01-02 ENCOUNTER — Emergency Department
Admission: EM | Admit: 2018-01-02 | Discharge: 2018-01-02 | Disposition: A | Discharge status: Home / Self Care | Payer: BLUE CROSS/BLUE SHIELD | Attending: Emergency Medicine | Admitting: Emergency Medicine

## 2018-01-02 ENCOUNTER — Encounter: Payer: Self-pay | Admitting: Emergency Medicine

## 2018-01-02 DIAGNOSIS — Z79899 Other long term (current) drug therapy: Secondary | ICD-10-CM | POA: Diagnosis not present

## 2018-01-02 DIAGNOSIS — E119 Type 2 diabetes mellitus without complications: Secondary | ICD-10-CM | POA: Insufficient documentation

## 2018-01-02 DIAGNOSIS — R079 Chest pain, unspecified: Secondary | ICD-10-CM | POA: Diagnosis present

## 2018-01-02 DIAGNOSIS — E039 Hypothyroidism, unspecified: Secondary | ICD-10-CM | POA: Insufficient documentation

## 2018-01-02 LAB — CBC
HCT: 46.2 % (ref 35.0–47.0)
Hemoglobin: 15.2 g/dL (ref 12.0–16.0)
MCH: 32.1 pg (ref 26.0–34.0)
MCHC: 32.8 g/dL (ref 32.0–36.0)
MCV: 97.9 fL (ref 80.0–100.0)
PLATELETS: 239 10*3/uL (ref 150–440)
RBC: 4.72 MIL/uL (ref 3.80–5.20)
RDW: 14.5 % (ref 11.5–14.5)
WBC: 7.3 10*3/uL (ref 3.6–11.0)

## 2018-01-02 LAB — BASIC METABOLIC PANEL
ANION GAP: 10 (ref 5–15)
BUN: 21 mg/dL — ABNORMAL HIGH (ref 6–20)
CALCIUM: 8.8 mg/dL — AB (ref 8.9–10.3)
CO2: 25 mmol/L (ref 22–32)
CREATININE: 0.85 mg/dL (ref 0.44–1.00)
Chloride: 99 mmol/L — ABNORMAL LOW (ref 101–111)
Glucose, Bld: 135 mg/dL — ABNORMAL HIGH (ref 65–99)
Potassium: 3.9 mmol/L (ref 3.5–5.1)
SODIUM: 134 mmol/L — AB (ref 135–145)

## 2018-01-02 LAB — TROPONIN I

## 2018-01-02 NOTE — ED Notes (Signed)
Lab called to come and attempt blood draw.

## 2018-01-02 NOTE — ED Notes (Signed)
Attempted blood draw with student x 3. Non successful

## 2018-01-02 NOTE — ED Triage Notes (Signed)
Pt with heaviness in her chest radiates through to her back started over the weekend with some shortness of breath. Pt thought it might be an anxiety attack and took some meds for same but the chest and back pain continues [facility].

## 2018-01-02 NOTE — ED Provider Notes (Signed)
[facility] Emergency Department Provider Note  ____________________________________________   First MD Initiated Contact with Patient 01/02/18 1710     (approximate)  I have reviewed the triage vital signs and the nursing notes.   HISTORY  Chief Complaint Anxiety and Chest Pain   HPI Cassidy Russell is a 62 y.o. female with a history of hypertension as well as obesity who is presenting to the emergency department today complaining of chest pressure as well as right sided thoracic back pain over the past 4 days.  She says that she is pain/pressure free at this time.  Says that the pressure was mild and across the front of her chest.  It was associated with shortness of breath, nausea and diaphoresis.  However, she says that these are the exact symptoms she experiences with her anxiety.  She says that the back pain is a new symptom.  she says that the pain in her back worsens with movement.  She denies the pain worsening with exertion. Denies any injury or heavy lifting prior to the episode starting. Denies any particularly anxiety provoking event that would've instigated this. Also says that she took Xanax twice over the weekend which relieved the pain and other symptoms did not return until the next afternoon at which point the symptoms lasted several hours. She denies any symptoms at this time. No history of coronary artery disease. Does not nor family history. Says that she smokes marijuana occasionally but does not smoke cigarettes.   Past Medical History:  Diagnosis Date  . Arthritis   . Bruises easily   . Cataracts, bilateral   . Diabetes mellitus without complication (HCC)   . Elevated BP without diagnosis of hypertension   . Headache    Migraines  . Heart murmur    lifelong per patient report  . Hyperlipidemia   . Hypothyroidism   . Marijuana use   . Metabolic syndrome   . Obesity   . Obsessive-compulsive behavior   . Panic   . Postmenopausal vaginal  bleeding     Patient Active Problem List   Diagnosis Date Noted  . Neck mass 04/15/2017  . Healthcare maintenance 08/11/2016  . Postmenopausal vaginal bleeding   . Panic   . Obesity   . Metabolic syndrome   . Marijuana use   . Hypothyroidism   . Hyperlipidemia   . Heart murmur   . Elevated BP without diagnosis of hypertension   . Diabetes mellitus without complication (HCC)   . Arthritis     Past Surgical History:  Procedure Laterality Date  . COLONOSCOPY    . DILATATION & CURETTAGE/HYSTEROSCOPY WITH MYOSURE N/A 10/19/2016   Procedure: DILATATION & CURETTAGE/HYSTEROSCOPY WITH MYOSURE;  Surgeon: Olivia Mackieichard Taavon, MD;  Location: [facility];  Service: Gynecology;  Laterality: N/A;  . NO PAST SURGERIES      Prior to Admission medications   Medication Sig Start Date End Date Taking? Authorizing Provider  fluticasone (FLONASE) 50 MCG/ACT nasal spray Place 2 sprays into both nostrils daily. 09/18/17   Joaquim Namuncan, Graham S, MD  hydrOXYzine (ATARAX/VISTARIL) 10 MG tablet Take 1-2 tablets (10-20 mg total) by mouth 3 (three) times daily as needed for anxiety. 04/14/17   Joaquim Namuncan, Graham S, MD  levothyroxine (SYNTHROID, LEVOTHROID) 50 MCG tablet Take 1 tablet (50 mcg total) by mouth daily before breakfast. 04/17/17   Joaquim Namuncan, Graham S, MD  TURMERIC PO Take 5 mLs by mouth at bedtime. Pt mixes with 1/4 teaspoon of cinnamon, a cup and a half  of soy milk, and 1/2 teaspoon of black pepper.    [provider]    Allergies Iodine; Shellfish allergy; and Barium-containing compounds  Family History  Adopted: Yes    Social History Social History   Tobacco Use  . Smoking status: Never Smoker  . Smokeless tobacco: Never Used  Substance Use Topics  . Alcohol use: Yes    Alcohol/week: 1.2 oz    Types: 2 Cans of beer per week    Comment: 1 a day  . Drug use: Yes    Types: Marijuana    Comment: occ. last time 10/03/2016    Review of Systems Constitutional: No fever/chills Eyes: No visual  changes. ENT: No sore throat. Cardiovascular: as above Respiratory: as above Gastrointestinal: No abdominal pain.    No diarrhea.  No constipation. Genitourinary: Negative for dysuria. Musculoskeletal: Negative for back pain. Skin: Negative for rash. Neurological: Negative for headaches, focal weakness or numbness.   ____________________________________________   PHYSICAL EXAM:  VITAL SIGNS: ED Triage Vitals  Enc Vitals Group     BP 01/02/18 1403 (!) 159/76     Pulse Rate 01/02/18 1403 75     Resp 01/02/18 1403 20     Temp 01/02/18 1403 98.3 F (36.8 C)     Temp Source 01/02/18 1403 Oral     SpO2 01/02/18 1403 95 %     Weight 01/02/18 1407 289 lb (131.1 kg)     Height --      Head Circumference --      Peak Flow --      Pain Score 01/02/18 1719 5     Pain Loc --      Pain Edu? --      Excl. in GC? --    Constitutional: Alert and oriented. Well appearing and in no acute distress. Eyes: Conjunctivae are normal.  Head: Atraumatic. Nose: No congestion/rhinnorhea. Mouth/Throat: Mucous membranes are moist.  Neck: No stridor.   Cardiovascular: Normal rate, regular rhythm. Grossly normal heart sounds.   Respiratory: Normal respiratory effort.  No retractions. Lungs CTAB. Gastrointestinal: Soft and nontender. No distention. No CVA tenderness. Musculoskeletal: No lower extremity tenderness nor edema.  No joint effusions.. Reproducible over the right sided rhomboid groups. No crepitus. No rash or ecchymosis present. Neurologic:  Normal speech and language. No gross focal neurologic deficits are appreciated. Skin:  Skin is warm, dry and intact. No rash noted. Psychiatric: Mood and affect are normal. Speech and behavior are normal.  ____________________________________________   LABS (all labs ordered are listed, but only abnormal results are displayed)  Labs Reviewed  BASIC METABOLIC PANEL - Abnormal; Notable for the following components:      Result Value   Sodium 134  (*)    Chloride 99 (*)    Glucose, Bld 135 (*)    BUN 21 (*)    Calcium 8.8 (*)    All other components within normal limits  CBC  TROPONIN I   ____________________________________________  EKG  ED ECG REPORT I, Arelia Longest, the attending physician, personally viewed and interpreted this ECG.   Date: 01/02/2018  EKG Time: 1424  Rate: 69  Rhythm: normal sinus rhythm  Axis: Left axis  Intervals:incomplete right bundle-branch block  ST&T Change: no ST segment elevation or depression. No abnormal T-wave inversion.  ____________________________________________  RADIOLOGY  no acute finding on the chest x-ray____________________________________________   PROCEDURES  Procedure(s) performed:   Procedures  Critical Care performed:   ____________________________________________   INITIAL IMPRESSION / ASSESSMENT  AND PLAN / ED COURSE  Pertinent labs & imaging results that were available during my care of the patient were reviewed by me and considered in my medical decision making (see chart for details).  Differential diagnosis includes, but is not limited to, ACS, aortic dissection, pulmonary embolism, cardiac tamponade, pneumothorax, pneumonia, pericarditis, myocarditis, GI-related causes including esophagitis/gastritis, and musculoskeletal chest wall pain.   As part of my medical decision making, I reviewed the following data within the electronic MEDICAL RECORD NUMBER notes from prior outpatient records  patient with 4 days of symptoms with reassuring workup. Symptom-free at this time. We discussed possible next steps and the patient would like to see cardiology office. I believe this is reasonable plan at this time. She'll return for any worsening or concerning symptoms. To be discharged at this time. We also discussed trying icy hot or another salvage the painful spot on her back as the pain appears reproducible may be musculoskeletal.inconsistent with pulmonary embolus.  Not tachycardic. Not hypoxic. No pleuritic pain. Possibly anxiety related as the symptoms were completely relieved with Xanax 2. ____________________________________________   FINAL CLINICAL IMPRESSION(S) / ED DIAGNOSES  chest pain.    NEW MEDICATIONS STARTED DURING THIS VISIT:  New Prescriptions   No medications on file     Note:  This document was prepared using Dragon voice recognition software and may include unintentional dictation errors.     Myrna Blazer, MD 01/02/18 [phone]

## 2018-01-06 DIAGNOSIS — R079 Chest pain, unspecified: Secondary | ICD-10-CM | POA: Insufficient documentation

## 2018-01-29 ENCOUNTER — Telehealth: Payer: Self-pay | Admitting: Family Medicine

## 2018-01-29 NOTE — Telephone Encounter (Signed)
I got a note from Dr. America BrownFath's office about TSH on patient.  TSH was up at 13.3.  He wanted me to address it.   I do not know if she is compliant with the levothyroxine, as that could have affected her results. Patient was supposed to come back for follow-up labs in ~07/2017 re: diabetes and hypothyroidism.  I do not see where she ever came back. If she is not going to come back then I should no longer be listed as the PCP and I can't help manage her conditions.   If she does plan on coming back here, then please find out if she has been compliant with her thyroid medication and let me know.    I did not put any orders yet.  Please send a copy of this note to Dr. Lady GaryFath.  Thanks.

## 2018-01-30 ENCOUNTER — Encounter: Payer: Self-pay | Admitting: Family Medicine

## 2018-01-30 LAB — LAB REPORT - SCANNED: TSH: 13.399

## 2018-01-30 NOTE — Telephone Encounter (Signed)
Patient says that she has taken her Levothyroxine religously, 50 mcg daily.  After Dr. Lady GaryFath found it to be so high, he suggested that she increase to 75 mcg daily and she has done that since her visit with him.  Patient had no recollection of having been supposed to come in for follow up labs but does intend to continue her care here.

## 2018-01-31 MED ORDER — LEVOTHYROXINE SODIUM 50 MCG PO TABS: 75.0000 ug | ORAL_TABLET | Freq: Every day | ORAL | Status: DC

## 2018-01-31 NOTE — Telephone Encounter (Signed)
Left detailed message on voicemail as requested by patient at previous conversation.

## 2018-01-31 NOTE — Telephone Encounter (Signed)
Then continue a day for now and recheck tsh and a1c at lab visit prior to OV in about 2 months.  Thanks.  Orders are in.

## 2018-05-16 ENCOUNTER — Other Ambulatory Visit: Payer: Self-pay | Admitting: Family Medicine

## 2018-05-16 ENCOUNTER — Telehealth: Payer: Self-pay | Admitting: Family Medicine

## 2018-05-16 NOTE — Telephone Encounter (Signed)
Copied from CRM [phone]#[id number]. Topic: Quick Communication - Rx Refill/Question >> May 16, 2018  7:26 AM Gerrianne ScalePayne, Viviana Trimble L wrote: Medication: levothyroxine (SYNTHROID, LEVOTHROID) 50 MCG tablet  Has the patient contacted their pharmacy? No. But I forward pt to pharmacy (Agent: If no, request that the patient contact the pharmacy for the refill.) (Agent: If yes, when and what did the pharmacy advise?)  Preferred Pharmacy (with phone number or street name): [id number]- [place], [place] - [address] [phone] (Phone) [phone] (Fax)      Agent: Please be advised that RX refills may take up to 3 business days. We ask that you follow-up with your pharmacy.

## 2018-05-16 NOTE — Telephone Encounter (Signed)
Attempted to contact pt regarding refill for synthroid; pt has lab appt on 05/19/18 but has no office visit scheduled; (last visit 04/04/17); [facility]ena, [facility],  and she says, I looked at lab result note 04/14/17 and Dr Armanda Heritageuncans instructions were to repeat TSH in 3 months before office visit. so pt would need labs done first and then schedule OV so could discuss lab results; appears pt is behind on appt."; Rena also says that Dr Para Marchuncan has appointment on  05/25/18; attempted to contact pt but line busy at [phone]; unable to leave message; if pt calls back please schedule physical appointment.

## 2018-05-18 ENCOUNTER — Other Ambulatory Visit: Payer: Self-pay | Admitting: Family Medicine

## 2018-05-18 DIAGNOSIS — E119 Type 2 diabetes mellitus without complications: Secondary | ICD-10-CM

## 2018-05-19 ENCOUNTER — Other Ambulatory Visit (INDEPENDENT_AMBULATORY_CARE_PROVIDER_SITE_OTHER): Payer: BLUE CROSS/BLUE SHIELD

## 2018-05-19 DIAGNOSIS — E119 Type 2 diabetes mellitus without complications: Secondary | ICD-10-CM

## 2018-05-19 LAB — MICROALBUMIN / CREATININE URINE RATIO
CREATININE, U: 116.4 mg/dL
Microalb Creat Ratio: 0.6 mg/g (ref 0.0–30.0)

## 2018-05-19 LAB — COMPREHENSIVE METABOLIC PANEL
ALT: 18 U/L (ref 0–35)
AST: 16 U/L (ref 0–37)
Albumin: 4.1 g/dL (ref 3.5–5.2)
Alkaline Phosphatase: 49 U/L (ref 39–117)
BILIRUBIN TOTAL: 0.5 mg/dL (ref 0.2–1.2)
BUN: 29 mg/dL — AB (ref 6–23)
CO2: 26 meq/L (ref 19–32)
Calcium: 9.6 mg/dL (ref 8.4–10.5)
Chloride: 103 mEq/L (ref 96–112)
Creatinine, Ser: 0.87 mg/dL (ref 0.40–1.20)
GFR: 69.81 mL/min (ref >60.00)
GLUCOSE: 147 mg/dL — AB (ref 70–99)
Potassium: 4.2 mEq/L (ref 3.5–5.1)
SODIUM: 139 meq/L (ref 135–145)
TOTAL PROTEIN: 8 g/dL (ref 6.0–8.3)

## 2018-05-19 LAB — LIPID PANEL
CHOL/HDL RATIO: 4
Cholesterol: 263 mg/dL — ABNORMAL HIGH (ref 0–200)
HDL: 62.4 mg/dL (ref >39.00)
LDL Cholesterol: 164 mg/dL — ABNORMAL HIGH (ref 0–99)
NONHDL: 200.35
Triglycerides: 180 mg/dL — ABNORMAL HIGH (ref 0.0–149.0)
VLDL: 36 mg/dL (ref 0.0–40.0)

## 2018-05-19 LAB — HEMOGLOBIN A1C: Hgb A1c MFr Bld: 6.8 % — ABNORMAL HIGH (ref 4.6–6.5)

## 2018-05-19 LAB — TSH: TSH: 12.83 u[IU]/mL — ABNORMAL HIGH (ref 0.35–4.50)

## 2018-06-23 ENCOUNTER — Ambulatory Visit: Payer: BLUE CROSS/BLUE SHIELD | Admitting: Family Medicine

## 2018-06-30 ENCOUNTER — Ambulatory Visit: Payer: BLUE CROSS/BLUE SHIELD | Admitting: Family Medicine

## 2018-06-30 ENCOUNTER — Encounter: Payer: Self-pay | Admitting: Family Medicine

## 2018-06-30 VITALS — BP 136/80 | HR 77 | Temp 98.9°F | Ht 62.5 in | Wt 278.5 lb

## 2018-06-30 DIAGNOSIS — E039 Hypothyroidism, unspecified: Secondary | ICD-10-CM

## 2018-06-30 DIAGNOSIS — E119 Type 2 diabetes mellitus without complications: Secondary | ICD-10-CM

## 2018-06-30 MED ORDER — FLUTICASONE PROPIONATE 50 MCG/ACT NA SUSP: 2.0000 | Freq: Every day | NASAL | 2 refills | Status: DC | PRN

## 2018-06-30 MED ORDER — LEVOTHYROXINE SODIUM 88 MCG PO TABS: 88.0000 ug | ORAL_TABLET | Freq: Every day | ORAL | 3 refills | Status: DC

## 2018-06-30 NOTE — Patient Instructions (Addendum)
Increase the thyroid medicine to 88mcg a day and recheck TSH and A1c in 3 months before a visit.  Try to get back to exercising.   Use the eat right diet.   Take care.  Glad to see you.   We will call about your referral.  Shirlee LimerickMarion or Alvina Chounastasiya will call you if you don't see one of them on the way out.

## 2018-06-30 NOTE — Progress Notes (Signed)
She had been back and forth in [place], caring for her father.  Discussed.    She had cards f/u this year, after CP episode.  Patient underwent a functional study which revealed no evidence of ischemia. She does have some dyspnea but had good tolerance of exercise on the functional study with no reversible ischemia.  Prev TSH elevation d/w pt.  She has been taking 75mcg for the last ~4 months.     Diabetes:  No meds Hypoglycemic episodes: no Hyperglycemic episodes:no Feet problems: R1st nail ingrown.   Blood Sugars averaging: not checked eye exam within last year: yes, she'll get me a copy of her eye exam ~01/2018.  PMH and SH reviewed  Meds, vitals, and allergies reviewed.   ROS: Per HPI unless specifically indicated in ROS section   GEN: nad, alert and oriented HEENT: mucous membranes moist NECK: supple CV: rrr. Murmur noted, old finding, SEM.   PULM: ctab, no inc wob ABD: soft, +bs EXT: no edema SKIN: no acute rash  Diabetic foot exam: Normal inspection No skin breakdown No calluses  Normal DP pulses Normal sensation to light touch and monofilament R1st nail partially ingrown, but does not appear infected.

## 2018-07-02 ENCOUNTER — Encounter: Payer: Self-pay | Admitting: Family Medicine

## 2018-07-02 NOTE — Assessment & Plan Note (Addendum)
Multiple issues need to be addressed over time.  Discussed with patient.  Needs routine follow-up. Increase levothyroxine to a day and recheck TSH and A1c in 3 months before a visit.  Discussed diet and exercise, handout given and explained regarding low carbohydrate diet.  >25 minutes spent in face to face time with patient, >50% spent in counselling or coordination of care.  Refer to podiatry.

## 2018-07-02 NOTE — Assessment & Plan Note (Signed)
Increase levothyroxine to a day and recheck TSH in 3 months before a visit.

## 2018-07-05 ENCOUNTER — Encounter: Payer: Self-pay | Admitting: Podiatry

## 2018-07-05 ENCOUNTER — Ambulatory Visit: Payer: BLUE CROSS/BLUE SHIELD | Admitting: Podiatry

## 2018-07-05 VITALS — BP 142/69 | HR 78 | Resp 16

## 2018-07-05 DIAGNOSIS — L600 Ingrowing nail: Secondary | ICD-10-CM

## 2018-07-05 MED ORDER — NEOMYCIN-POLYMYXIN-HC 1 % OT SOLN: OTIC | 1 refills | Status: AC

## 2018-07-05 NOTE — Patient Instructions (Signed)

## 2018-07-05 NOTE — Progress Notes (Signed)
Subjective:  Patient ID: Cassidy Russell, female    DOB: 11/06/1955,  MRN: [id number] HPI Chief Complaint  Patient presents with  . Toe Pain    Hallux right - medial border, injury to nail last year, old nail fell off and new nail grew back deformed, tender, red sometimes  . New Patient (Initial Visit)    62 y.o. female presents with the above complaint.   ROS: Denies fever chills nausea vomiting muscle aches pains calf pain back pain chest pain shortness of breath.  Past Medical History:  Diagnosis Date  . Arthritis   . Bruises easily   . Cataracts, bilateral   . Diabetes mellitus without complication (HCC)   . Elevated BP without diagnosis of hypertension   . Headache    Migraines  . Heart murmur    lifelong per patient report  . Hyperlipidemia   . Hypothyroidism   . Marijuana use   . Metabolic syndrome   . Obesity   . Obsessive-compulsive behavior   . Panic   . Postmenopausal vaginal bleeding    Past Surgical History:  Procedure Laterality Date  . COLONOSCOPY    . DILATATION & CURETTAGE/HYSTEROSCOPY WITH MYOSURE N/A 10/19/2016   Procedure: DILATATION & CURETTAGE/HYSTEROSCOPY WITH MYOSURE;  Surgeon: Olivia Mackieichard Taavon, MD;  Location: [facility];  Service: Gynecology;  Laterality: N/A;  . NO PAST SURGERIES      Current Outpatient Medications:  .  ALPRAZolam (XANAX) 1 MG tablet, 1 tab by mouth daily prn anxiety. Use less than two per day, Disp: , Rfl:  .  fluticasone (FLONASE) 50 MCG/ACT nasal spray, Place 2 sprays into both nostrils daily as needed for allergies or rhinitis., Disp: 16 g, Rfl: 2 .  hydrOXYzine (ATARAX/VISTARIL) 10 MG tablet, Take 1-2 tablets (10-20 mg total) by mouth 3 (three) times daily as needed for anxiety., Disp: 30 tablet, Rfl: 1 .  levothyroxine (SYNTHROID, LEVOTHROID) 88 MCG tablet, Take 1 tablet (88 mcg total) by mouth daily before breakfast., Disp: 90 tablet, Rfl: 3 .  NEOMYCIN-POLYMYXIN-HYDROCORTISONE (CORTISPORIN) 1 % SOLN OTIC solution, Apply 1-2 drops to  toe BID after soaking, Disp: 10 mL, Rfl: 1 .  TURMERIC PO, Take 5 mLs by mouth at bedtime. Pt mixes with 1/4 teaspoon of cinnamon, a cup and a half of soy milk, and 1/2 teaspoon of black pepper., Disp: , Rfl:   Allergies  Allergen Reactions  . Iodine Anaphylaxis and Itching  . Shellfish Allergy Anaphylaxis and Itching  . Barium-Containing Compounds   . Iodinated Diagnostic Agents     Other reaction(s): Unknown   Review of Systems Objective:   Vitals:   07/05/18 1504  BP: (!) 142/69  Pulse: 78  Resp: 16    General: Well developed, nourished, in no acute distress, alert and oriented x3   Dermatological: Skin is warm, dry and supple bilateral. Nails x 10 are well maintained; remaining integument appears unremarkable at this time. There are no open sores, no preulcerative lesions, no rash or signs of infection present.  Sharp incurvated negative toenail medial border painful palpation more than likely associated with nail dystrophy.  Vascular: Dorsalis Pedis artery and Posterior Tibial artery pedal pulses are 2/4 bilateral with immedate capillary fill time. Pedal hair growth present. No varicosities and no lower extremity edema present bilateral.   Neruologic: Grossly intact via light touch bilateral. Vibratory intact via tuning fork bilateral. Protective threshold with Semmes Wienstein monofilament intact to all pedal sites bilateral. Patellar and Achilles deep tendon reflexes 2+ bilateral. No Babinski or  clonus noted bilateral.   Musculoskeletal: No gross boney pedal deformities bilateral. No pain, crepitus, or limitation noted with foot and ankle range of motion bilateral. Muscular strength 5/5 in all groups tested bilateral.  Pes planus bilateral hallux limitus first metatarsophalangeal joint right.  Gait: Unassisted, Nonantalgic.    Radiographs:  None taken  Assessment & Plan:   Assessment: Ingrown toenail tibial border hallux right.  Hallux limitus first metatarsophalangeal  joint right.   Plan: Chemical matricectomy was performed along the medial border of the hallux right.  She tolerated procedure well without complications.  Good prescription for Cortisporin Otic to be applied twice daily after soaking.  She was also provided with both oral and written home-going instructions for the care and soaking of her toes.  Follow-up with her in 2 to 3 weeks at which time we will get x-rays of the bilateral foot pain     Cassidy Russell, [place]

## 2018-07-20 ENCOUNTER — Ambulatory Visit: Payer: BLUE CROSS/BLUE SHIELD | Admitting: Podiatry

## 2018-07-20 ENCOUNTER — Encounter: Payer: Self-pay | Admitting: Podiatry

## 2018-07-20 DIAGNOSIS — Z09 Encounter for follow-up examination after completed treatment for conditions other than malignant neoplasm: Secondary | ICD-10-CM

## 2018-07-20 NOTE — Progress Notes (Signed)
This patient returns to the office following nail surgery two  week ago by Dr Alita Chyle.  The patient says toe has been soaked and bandaged as directed.  There has been improvement of the toe since the surgery has been performed. The patient presents for continued evaluation and treatment.  GENERAL APPEARANCE: Alert, conversant. Appropriately groomed. No acute distress.  VASCULAR: Pedal pulses palpable at  [facility] and PT bilateral.  Capillary refill time is immediate to all digits,  Normal temperature gradient.    NEUROLOGIC: sensation is normal to 5.07 monofilament at 5/5 sites bilateral.  Light touch is intact bilateral, Muscle strength normal.  MUSCULOSKELETAL: acceptable muscle strength, tone and stability bilateral.  Intrinsic muscluature intact bilateral.  Rectus appearance of foot and digits noted bilateral.   DERMATOLOGIC: skin color, texture, and turgor are within normal limits.  No preulcerative lesions or ulcers  are seen, no interdigital maceration noted.   NAILS  There is necrotic tissue along the nail groove  In the absence of redness swelling and pain.  DX  S/p nail surgery  ROV  Home instructions were discussed.  Patient to call the office if there are any questions or concerns.   Helane Gunther DPM

## 2018-07-27 ENCOUNTER — Other Ambulatory Visit: Payer: Self-pay | Admitting: Family Medicine

## 2018-08-30 IMAGING — CR DG CHEST 2V
1 series · 2 of 2 positions shown · non-contrast
Comparison: None.

CLINICAL DATA: Chest heaviness

EXAM:
CHEST - 2 VIEW

[Series 1: w chest pa · 0.14mm/px · 2 of 2 slices shown]
[im 1/2]
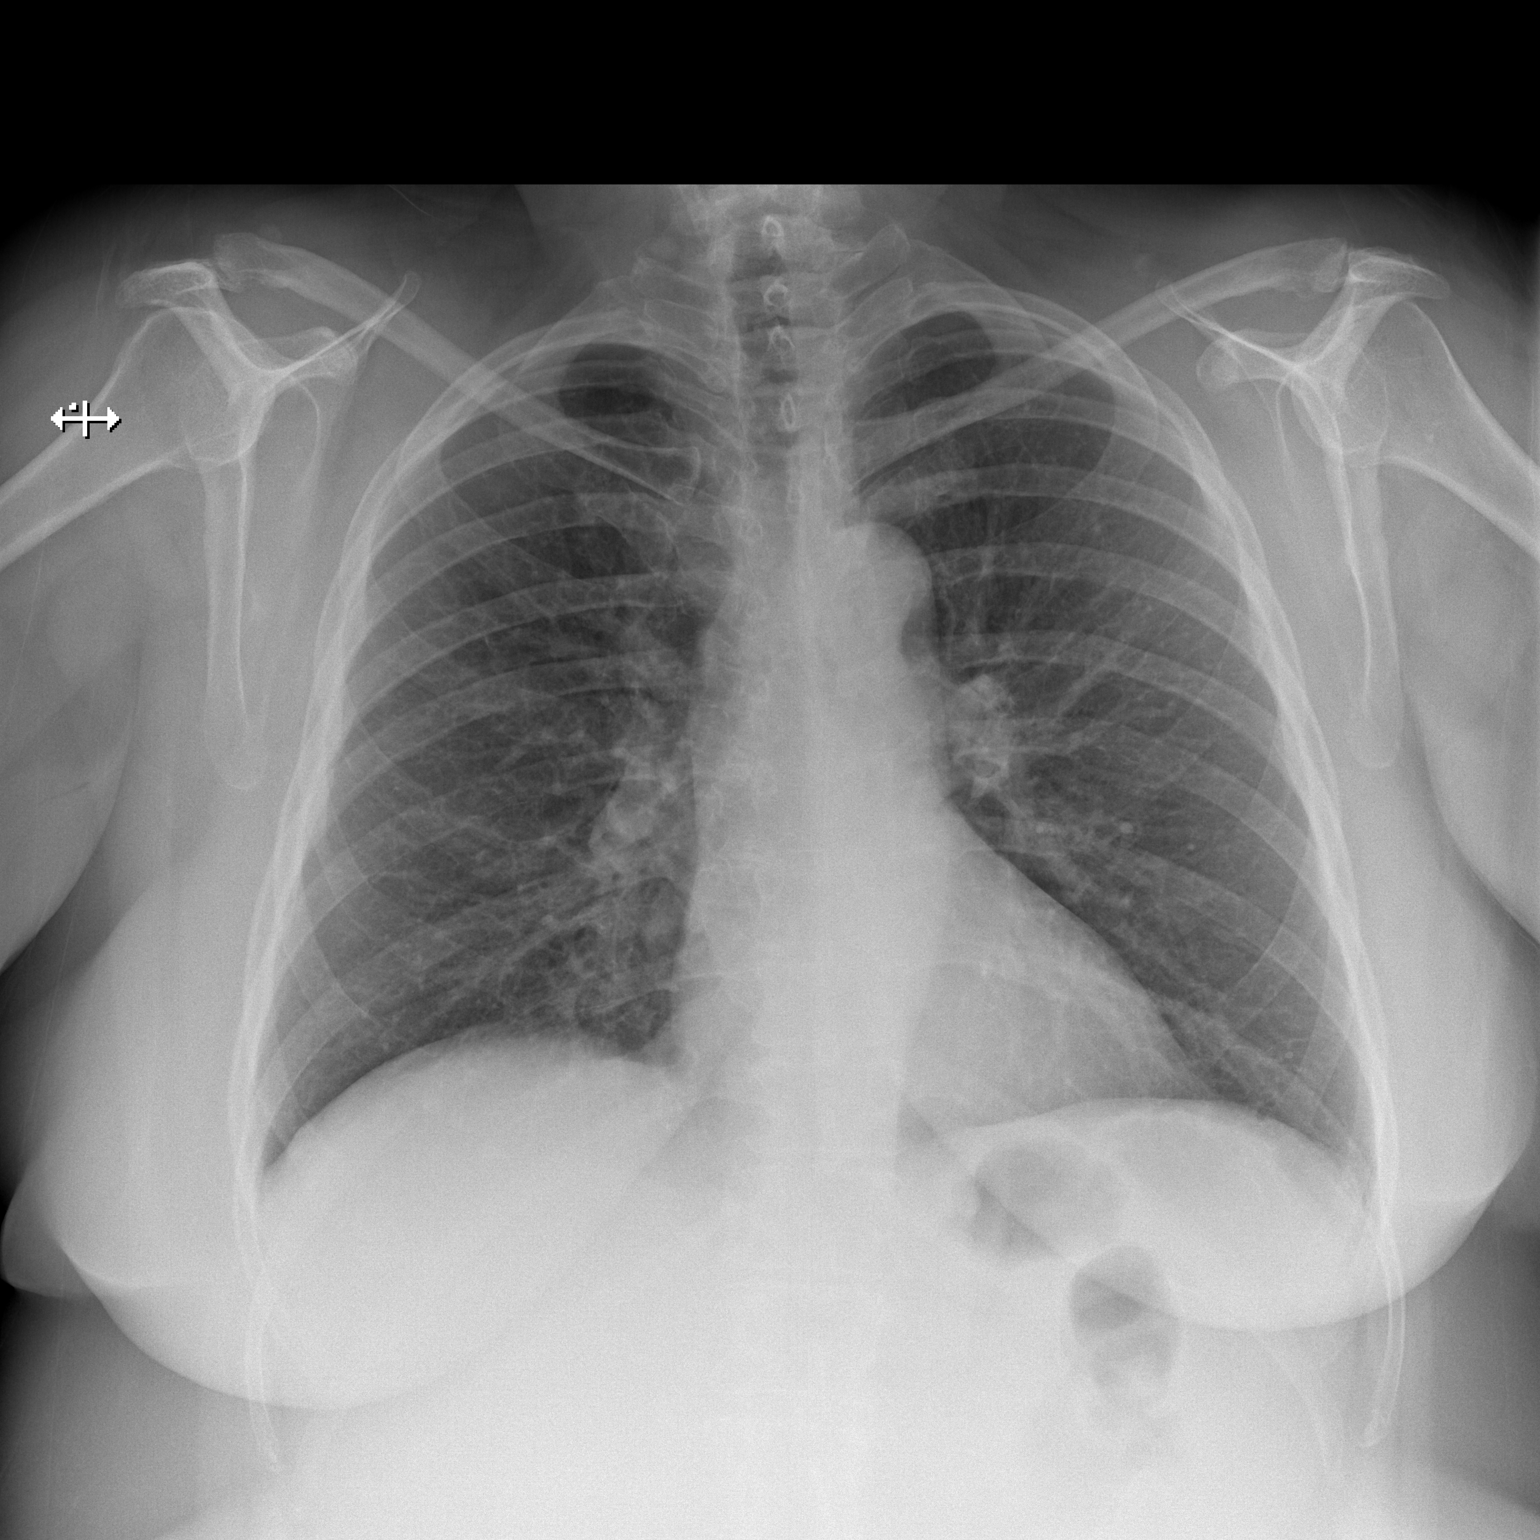
[im 2/2]
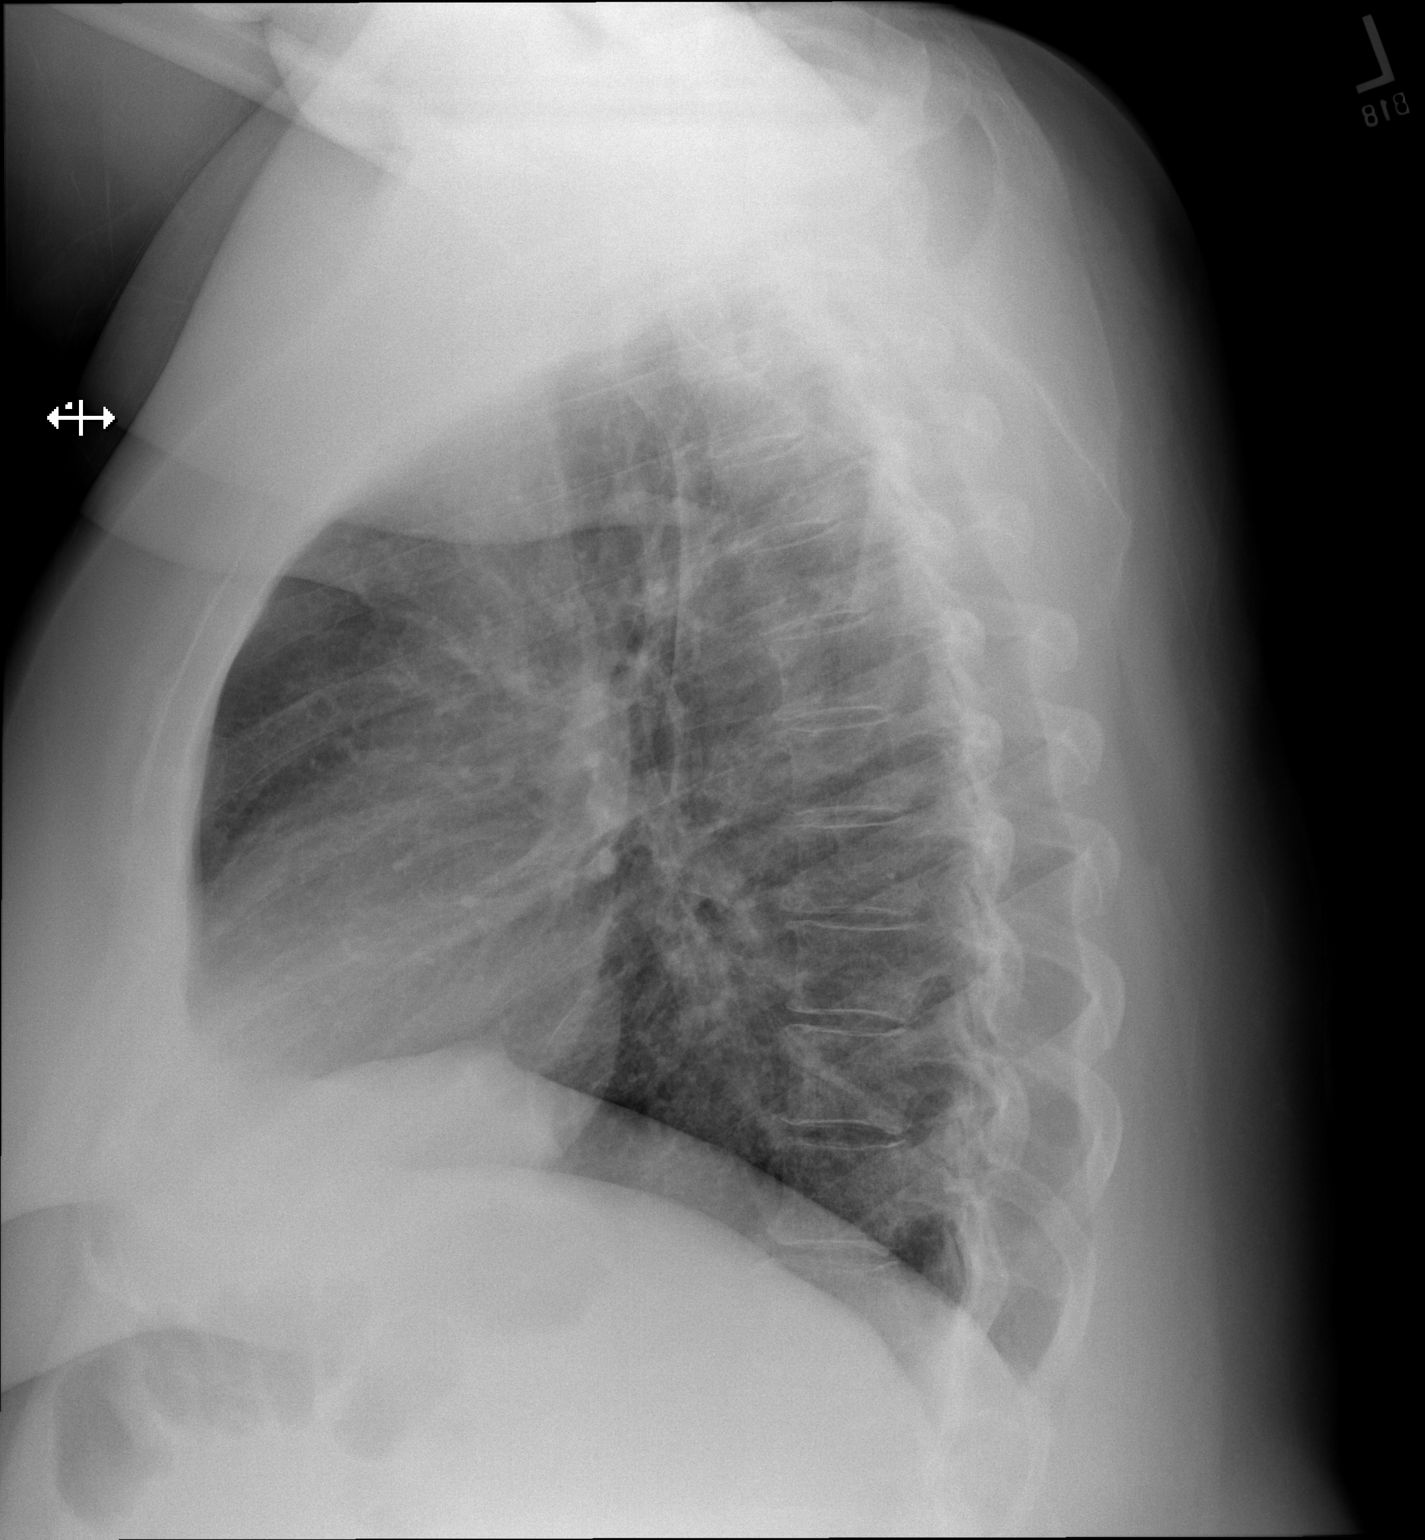

[2 of 2 positions shown; findings below may reference images not displayed]

FINDINGS: The heart size and mediastinal contours are within normal limits.
Both lungs are clear. The visualized skeletal structures are
unremarkable.
IMPRESSION: No active cardiopulmonary disease.

## 2018-09-25 ENCOUNTER — Other Ambulatory Visit (INDEPENDENT_AMBULATORY_CARE_PROVIDER_SITE_OTHER): Payer: BLUE CROSS/BLUE SHIELD

## 2018-09-25 DIAGNOSIS — E039 Hypothyroidism, unspecified: Secondary | ICD-10-CM | POA: Diagnosis not present

## 2018-09-25 DIAGNOSIS — E119 Type 2 diabetes mellitus without complications: Secondary | ICD-10-CM

## 2018-09-25 LAB — HEMOGLOBIN A1C: Hgb A1c MFr Bld: 6.6 % — ABNORMAL HIGH (ref 4.6–6.5)

## 2018-09-25 LAB — TSH: TSH: 8.9 u[IU]/mL — AB (ref 0.35–4.50)

## 2018-09-29 ENCOUNTER — Ambulatory Visit: Payer: BLUE CROSS/BLUE SHIELD | Admitting: Family Medicine

## 2018-09-29 ENCOUNTER — Encounter: Payer: Self-pay | Admitting: Family Medicine

## 2018-09-29 DIAGNOSIS — E039 Hypothyroidism, unspecified: Secondary | ICD-10-CM | POA: Diagnosis not present

## 2018-09-29 DIAGNOSIS — E119 Type 2 diabetes mellitus without complications: Secondary | ICD-10-CM

## 2018-09-29 MED ORDER — LEVOTHYROXINE SODIUM 100 MCG PO TABS: 100.0000 ug | ORAL_TABLET | Freq: Every day | ORAL | 3 refills | Status: DC

## 2018-09-29 NOTE — Patient Instructions (Addendum)
Keep working on diet and exercise.  Thanks for your effort.  Call about an eye exam when possible.   Increase your thyroid medicine and recheck labs in about 3 months, either here or in [place].   Take care.  Glad to see you.

## 2018-09-29 NOTE — Progress Notes (Signed)
Diabetes:  No meds.  Hypoglycemic episodes: no sx Hyperglycemic episodes: no sx Feet problems: she had arthritis but no tingling.   Blood Sugars averaging: not checked at home.   eye exam within last year: d/w pt about follow up.   A1c better, d/w pt.   She is working on weight loss.  D/w pt.  She is happy about that.    TSH elevated but improved.  D/w pt.  See orders.   She is going to [place] after xmas to see her father and help with house repairs, etc. see after visit summary.  Meds, vitals, and allergies reviewed.   ROS: Per HPI unless specifically indicated in ROS section   GEN: nad, alert and oriented HEENT: mucous membranes moist NECK: supple w/o LA CV: rrr. PULM: ctab, no inc wob ABD: soft, +bs EXT: no edema SKIN: well perfused.

## 2018-10-01 NOTE — Assessment & Plan Note (Signed)
Discussed with patient. Keep working on diet and exercise.  Call about an eye exam when possible.   recheck labs in about 3 months, either here or in [place].   She agrees to plan.

## 2018-10-01 NOTE — Assessment & Plan Note (Signed)
TSH elevated but overall improved. Increase replacement to 100 mcg/day.  Recheck labs in about 3 months, either here or in [place].  If she needs to she can contact us and we can send a lab slip for her to have labs collected in [place].

## 2019-10-02 ENCOUNTER — Encounter: Payer: Self-pay | Admitting: Internal Medicine

## 2019-10-02 ENCOUNTER — Ambulatory Visit (INDEPENDENT_AMBULATORY_CARE_PROVIDER_SITE_OTHER): Payer: BLUE CROSS/BLUE SHIELD | Admitting: Internal Medicine

## 2019-10-02 VITALS — BP 131/82 | HR 70 | Temp 98.6°F

## 2019-10-02 DIAGNOSIS — R5383 Other fatigue: Secondary | ICD-10-CM

## 2019-10-02 DIAGNOSIS — R509 Fever, unspecified: Secondary | ICD-10-CM

## 2019-10-02 DIAGNOSIS — R109 Unspecified abdominal pain: Secondary | ICD-10-CM

## 2019-10-02 DIAGNOSIS — R519 Headache, unspecified: Secondary | ICD-10-CM | POA: Diagnosis not present

## 2019-10-02 DIAGNOSIS — R197 Diarrhea, unspecified: Secondary | ICD-10-CM | POA: Diagnosis not present

## 2019-10-02 DIAGNOSIS — H9313 Tinnitus, bilateral: Secondary | ICD-10-CM

## 2019-10-02 NOTE — Progress Notes (Signed)
Virtual Visit via Video Note  I connected with Cassidy KaufmannKay Saini on 10/02/19 at 11:30 AM EST by a video enabled telemedicine application and verified that I am speaking with the correct person using two identifiers.  Location: Patient: Home Provider: Office   I discussed the limitations of evaluation and management by telemedicine and the availability of in person appointments. The patient expressed understanding and agreed to proceed.  History of Present Illness:  Pt reports headache, ringing in the ears, abdominal cramping, dry heaving, fatigue and fever. The headache is located in her forehead and behind her eyes. She describes the pain as pressure. She denies dizziness, visual changes, sensitivity to light or sound, nausea or vomiting. She denies ear pain or loss of hearing. She denies vomiting but reports her stools are loose and watery, no blood. She has run a fever up to 102, but denies chills and body aches. She denies runny nose, nasal congestion, loss of taste/smell, sore throat, cough or SOB. She has tried ginger, Sudafed, BC powder and cool compresses without relief. She has not had sick contacts or exposure to COVID 19 that she is aware of.    Past Medical History:  Diagnosis Date  . Arthritis   . Bruises easily   . Cataracts, bilateral   . Diabetes mellitus without complication (HCC)   . Elevated BP without diagnosis of hypertension   . Headache    Migraines  . Heart murmur    lifelong per patient report  . Hyperlipidemia   . Hypothyroidism   . Marijuana use   . Metabolic syndrome   . Obesity   . Obsessive-compulsive behavior   . Panic   . Postmenopausal vaginal bleeding     Current Outpatient Medications  Medication Sig Dispense Refill  . ALPRAZolam (XANAX) 1 MG tablet 1 tab by mouth daily prn anxiety. Use less than two per day    . fluticasone (FLONASE) 50 MCG/ACT nasal spray SPRAY 2 SPRAYS INTO EACH NOSTRIL EVERY DAY 16 g 11  . hydrOXYzine (ATARAX/VISTARIL) 10 MG  tablet Take 1-2 tablets (10-20 mg total) by mouth 3 (three) times daily as needed for anxiety. 30 tablet 1  . levothyroxine (SYNTHROID, LEVOTHROID) 100 MCG tablet Take 1 tablet (100 mcg total) by mouth daily before breakfast. 90 tablet 3  . NEOMYCIN-POLYMYXIN-HYDROCORTISONE (CORTISPORIN) 1 % SOLN OTIC solution Apply 1-2 drops to toe BID after soaking 10 mL 1  . TURMERIC PO Take 5 mLs by mouth at bedtime. Pt mixes with 1/4 teaspoon of cinnamon, a cup and a half of soy milk, and 1/2 teaspoon of black pepper.     No current facility-administered medications for this visit.    Allergies  Allergen Reactions  . Iodine Anaphylaxis and Itching  . Shellfish Allergy Anaphylaxis and Itching  . Barium-Containing Compounds   . Iodinated Diagnostic Agents     Other reaction(s): Unknown    Family History  Adopted: Yes    Social History   Socioeconomic History  . Marital status: Single    Spouse name: Not on file  . Number of children: Not on file  . Years of education: Not on file  . Highest education level: Not on file  Occupational History  . Not on file  Tobacco Use  . Smoking status: Never Smoker  . Smokeless tobacco: Never Used  Substance and Sexual Activity  . Alcohol use: Yes    Alcohol/week: 2.0 standard drinks    Types: 2 Cans of beer per week    Comment:  1 a day  . Drug use: Yes    Types: Marijuana    Comment: occ. last time 10/03/2016  . Sexual activity: Not on file  Other Topics Concern  . Not on file  Social History Narrative   Lives with domestic partner (he travels frequently), no kids, moved to area 2015   Retired Psychiatric nurse.   Social Determinants of Radio broadcast assistant Strain:   . Difficulty of Paying Living Expenses: Not on file  Food Insecurity:   . Worried About Charity fundraiser in the Last Year: Not on file  . Ran Out of Food in the Last Year: Not on file  Transportation Needs:   . Lack of Transportation (Medical): Not on file  . Lack of  Transportation (Non-Medical): Not on file  Physical Activity:   . Days of Exercise per Week: Not on file  . Minutes of Exercise per Session: Not on file  Stress:   . Feeling of Stress : Not on file  Social Connections:   . Frequency of Communication with Friends and Family: Not on file  . Frequency of Social Gatherings with Friends and Family: Not on file  . Attends Religious Services: Not on file  . Active Member of Clubs or Organizations: Not on file  . Attends Archivist Meetings: Not on file  . Marital Status: Not on file  Intimate Partner Violence:   . Fear of Current or Ex-Partner: Not on file  . Emotionally Abused: Not on file  . Physically Abused: Not on file  . Sexually Abused: Not on file     Constitutional: Pt reports fatigue, fever and headache. Denies malaise, or abrupt weight changes.  HEENT: Pt reports ringing in the ears. Denies eye pain, eye redness, ear pain, wax buildup, runny nose, nasal congestion, bloody nose, or sore throat. Respiratory: Denies difficulty breathing, shortness of breath, cough or sputum production.   Cardiovascular: Denies chest pain, chest tightness, palpitations or swelling in the hands or feet.  Gastrointestinal: Pt reports abdominal cramping, nausea. Denies bloating, constipation, diarrhea or blood in the stool.  Skin: Denies redness, rashes, lesions or ulcercations.   No other specific complaints in a complete review of systems (except as listed in HPI above).    Observations/Objective: BP 131/82   Pulse 70   Temp 98.6 F (37 C) (Oral)   Wt Readings from Last 3 Encounters:  09/29/18 275 lb 8 oz (125 kg)  06/30/18 278 lb 8 oz (126.3 kg)  01/02/18 289 lb (131.1 kg)    General: Appears her stated age, obese, in NAD. Skin: Warm, dry and intact. No rashes noted. HEENT: Head: normal shape and size; Nose: does not sound congested; Throat/Mouth: does not sound hoarse.  Pulmonary/Chest: Normal effort. No respiratory distress.   Neurological: Alert and oriented.    BMET    Component Value Date/Time   NA 139 05/19/2018 0914   K 4.2 05/19/2018 0914   CL 103 05/19/2018 0914   CO2 26 05/19/2018 0914   GLUCOSE 147 (H) 05/19/2018 0914   BUN 29 (H) 05/19/2018 0914   CREATININE 0.87 05/19/2018 0914   CALCIUM 9.6 05/19/2018 0914   GFRNONAA >60 01/02/2018 1620   GFRAA >60 01/02/2018 1620    Lipid Panel     Component Value Date/Time   CHOL 263 (H) 05/19/2018 0914   TRIG 180.0 (H) 05/19/2018 0914   HDL 62.40 05/19/2018 0914   CHOLHDL 4 05/19/2018 0914   VLDL 36.0 05/19/2018 0914  LDLCALC 164 (H) 05/19/2018 0914    CBC    Component Value Date/Time   WBC 7.3 01/02/2018 1620   RBC 4.72 01/02/2018 1620   HGB 15.2 01/02/2018 1620   HCT 46.2 01/02/2018 1620   PLT 239 01/02/2018 1620   MCV 97.9 01/02/2018 1620   MCH 32.1 01/02/2018 1620   MCHC 32.8 01/02/2018 1620   RDW 14.5 01/02/2018 1620    Hgb A1C Lab Results  Component Value Date   HGBA1C 6.6 (H) 09/25/2018       Assessment and Plan:  Acute Headache, Tinnitus, Abdominal Cramping, Diarrhea, Fatigue and Fever:  DDX include COVID 19 vs other viral illness Discussed symptomatic care: rest and fluids Can take Tylenol/Ibuprofen OTC Continue ginger and BC powders per request She wants to get COVID tested, information provided Discussed self quarantine until test results are back Encouraged social distancing, masking and frequent handwashing, even in the home ER precautions discussed   Follow Up Instructions:    I discussed the assessment and treatment plan with the patient. The patient was provided an opportunity to ask questions and all were answered. The patient agreed with the plan and demonstrated an understanding of the instructions.   The patient was advised to call back or seek an in-person evaluation if the symptoms worsen or if the condition fails to improve as anticipated.    Nicki Reaper, NP

## 2019-10-02 NOTE — Patient Instructions (Signed)
COVID-19 Frequently Asked Questions °COVID-19 (coronavirus disease) is an infection that is caused by a large family of viruses. Some viruses cause illness in people and others cause illness in animals like camels, cats, and bats. In some cases, the viruses that cause illness in animals can spread to humans. °Where did the coronavirus come from? °In December 2019, [place] told the World Health Organization (WHO) of several cases of lung disease (human respiratory illness). These cases were linked to an open seafood and livestock market in the city of [place]. The link to the seafood and livestock market suggests that the virus may have spread from animals to humans. However, since that first outbreak in December, the virus has also been shown to spread from person to person. °What is the name of the disease and the virus? °Disease name °Early on, this disease was called novel coronavirus. This is because scientists determined that the disease was caused by a new (novel) respiratory virus. The World Health Organization (WHO) has now named the disease COVID-19, or coronavirus disease. °Virus name °The virus that causes the disease is called severe acute respiratory syndrome coronavirus 2 (SARS-CoV-2). °More information on disease and virus naming °World Health Organization (WHO): www.who.int/emergencies/diseases/novel-coronavirus-2019/technical-guidance/naming-the-coronavirus-disease-(covid-2019)-and-the-virus-that-causes-it °Who is at risk for complications from coronavirus disease? °Some people may be at higher risk for complications from coronavirus disease. This includes older adults and people who have chronic diseases, such as heart disease, diabetes, and lung disease. °If you are at higher risk for complications, take these extra precautions: °· Avoid close contact with people who are sick or have a fever or cough. Stay at least 3-6 ft (1-2 m) away from them, if possible. °· Wash your hands often with soap and  water for at least 20 seconds. °· Avoid touching your face, mouth, nose, or eyes. °· Keep supplies on hand at home, such as food, medicine, and cleaning supplies. °· Stay home as much as possible. °· Avoid social gatherings and travel. °How does coronavirus disease spread? °The virus that causes coronavirus disease spreads easily from person to person (is contagious). There are also cases of community-spread disease. This means the disease has spread to: °· People who have no known contact with other infected people. °· People who have not traveled to areas where there are known cases. °It appears to spread from one person to another through droplets from coughing or sneezing. °Can I get the virus from touching surfaces or objects? °There is still a lot that we do not know about the virus that causes coronavirus disease. Scientists are basing a lot of information on what they know about similar viruses, such as: °· Viruses cannot generally survive on surfaces for long. They need a human body (host) to survive. °· It is more likely that the virus is spread by close contact with people who are sick (direct contact), such as through: °? Shaking hands or hugging. °? Breathing in respiratory droplets that travel through the air. This can happen when an infected person coughs or sneezes on or near other people. °· It is less likely that the virus is spread when a person touches a surface or object that has the virus on it (indirect contact). The virus may be able to enter the body if the person touches a surface or object and then touches his or her face, eyes, nose, or mouth. °Can a person spread the virus without having symptoms of the disease? °It may be possible for the virus to spread before a person   has symptoms of the disease, but this is most likely not the main way the virus is spreading. It is more likely for the virus to spread by being in close contact with people who are sick and breathing in the respiratory  droplets of a sick person's cough or sneeze. °What are the symptoms of coronavirus disease? °Symptoms vary from person to person and can range from mild to severe. Symptoms may include: °· Fever. °· Cough. °· Tiredness, weakness, or fatigue. °· Fast breathing or feeling short of breath. °These symptoms can appear anywhere from 2 to 14 days after you have been exposed to the virus. If you develop symptoms, call your health care provider. People with severe symptoms may need hospital care. °If I am exposed to the virus, how long does it take before symptoms start? °Symptoms of coronavirus disease may appear anywhere from 2 to 14 days after a person has been exposed to the virus. If you develop symptoms, call your health care provider. °Should I be tested for this virus? °Your health care provider will decide whether to test you based on your symptoms, history of exposure, and your risk factors. °How does a health care provider test for this virus? °Health care providers will collect samples to send for testing. Samples may include: °· Taking a swab of fluid from the nose. °· Taking fluid from the lungs by having you cough up mucus (sputum) into a sterile cup. °· Taking a blood sample. °· Taking a stool or urine sample. °Is there a treatment or vaccine for this virus? °Currently, there is no vaccine to prevent coronavirus disease. Also, there are no medicines like antibiotics or antivirals to treat the virus. A person who becomes sick is given supportive care, which means rest and fluids. A person may also relieve his or her symptoms by using over-the-counter medicines that treat sneezing, coughing, and runny nose. These are the same medicines that a person takes for the common cold. °If you develop symptoms, call your health care provider. People with severe symptoms may need hospital care. °What can I do to protect myself and my family from this virus? ° °  ° °You can protect yourself and your family by taking the  same actions that you would take to prevent the spread of other viruses. Take the following actions: °· Wash your hands often with soap and water for at least 20 seconds. If soap and water are not available, use alcohol-based hand sanitizer. °· Avoid touching your face, mouth, nose, or eyes. °· Cough or sneeze into a tissue, sleeve, or elbow. Do not cough or sneeze into your hand or the air. °? If you cough or sneeze into a tissue, throw it away immediately and wash your hands. °· Disinfect objects and surfaces that you frequently touch every day. °· Avoid close contact with people who are sick or have a fever or cough. Stay at least 3-6 ft (1-2 m) away from them, if possible. °· Stay home if you are sick, except to get medical care. Call your health care provider before you get medical care. °· Make sure your vaccines are up to date. Ask your health care provider what vaccines you need. °What should I do if I need to travel? °Follow travel recommendations from your local health authority, the CDC, and WHO. °Travel information and advice °· Centers for Disease Control and Prevention (CDC): www.cdc.gov/coronavirus/2019-ncov/travelers/index.html °· World Health Organization (WHO): www.who.int/emergencies/diseases/novel-coronavirus-2019/travel-advice °Know the risks and take action to protect your health °·   You are at higher risk of getting coronavirus disease if you are traveling to areas with an outbreak or if you are exposed to travelers from areas with an outbreak. °· Wash your hands often and practice good hygiene to lower the risk of catching or spreading the virus. °What should I do if I am sick? °General instructions to stop the spread of infection °· Wash your hands often with soap and water for at least 20 seconds. If soap and water are not available, use alcohol-based hand sanitizer. °· Cough or sneeze into a tissue, sleeve, or elbow. Do not cough or sneeze into your hand or the air. °· If you cough or  sneeze into a tissue, throw it away immediately and wash your hands. °· Stay home unless you must get medical care. Call your health care provider or local health authority before you get medical care. °· Avoid public areas. Do not take public transportation, if possible. °· If you can, wear a mask if you must go out of the house or if you are in close contact with someone who is not sick. °Keep your home clean °· Disinfect objects and surfaces that are frequently touched every day. This may include: °? Counters and tables. °? Doorknobs and light switches. °? Sinks and faucets. °? Electronics such as phones, remote controls, keyboards, computers, and tablets. °· Wash dishes in hot, soapy water or use a dishwasher. Air-dry your dishes. °· Wash laundry in hot water. °Prevent infecting other household members °· Let healthy household members care for children and pets, if possible. If you have to care for children or pets, wash your hands often and wear a mask. °· Sleep in a different bedroom or bed, if possible. °· Do not share personal items, such as razors, toothbrushes, deodorant, combs, brushes, towels, and washcloths. °Where to find more information °Centers for Disease Control and Prevention (CDC) °· Information and news updates: www.cdc.gov/coronavirus/2019-ncov °World Health Organization (WHO) °· Information and news updates: www.who.int/emergencies/diseases/novel-coronavirus-2019 °· Coronavirus health topic: www.who.int/health-topics/coronavirus °· Questions and answers on COVID-19: www.who.int/news-room/q-a-detail/q-a-coronaviruses °· Global tracker: who.sprinklr.com °American Academy of Pediatrics (AAP) °· Information for families: www.healthychildren.org/English/health-issues/conditions/chest-lungs/Pages/2019-Novel-Coronavirus.aspx °The coronavirus situation is changing rapidly. Check your local health authority website or the CDC and WHO websites for updates and news. °When should I contact a health care  provider? °· Contact your health care provider if you have symptoms of an infection, such as fever or cough, and you: °? Have been near anyone who is known to have coronavirus disease. °? Have come into contact with a person who is suspected to have coronavirus disease. °? Have traveled outside of the country. °When should I get emergency medical care? °· Get help right away by calling your local emergency services (911 in the [place] if you have: °? Trouble breathing. °? Pain or pressure in your chest. °? Confusion. °? Blue-tinged lips and fingernails. °? Difficulty waking from sleep. °? Symptoms that get worse. °Let the emergency medical personnel know if you think you have coronavirus disease. °Summary °· A new respiratory virus is spreading from person to person and causing COVID-19 (coronavirus disease). °· The virus that causes COVID-19 appears to spread easily. It spreads from one person to another through droplets from coughing or sneezing. °· Older adults and those with chronic diseases are at higher risk of disease. If you are at higher risk for complications, take extra precautions. °· There is currently no vaccine to prevent coronavirus disease. There are no medicines, such as antibiotics or   antivirals, to treat the virus. °· You can protect yourself and your family by washing your hands often, avoiding touching your face, and covering your coughs and sneezes. °This information is not intended to replace advice given to you by your health care provider. Make sure you discuss any questions you have with your health care provider. °Document Released: 01/30/2019 Document Revised: 01/30/2019 Document Reviewed: 01/30/2019 °Elsevier Patient Education © 2020 [employer] ° °

## 2019-10-03 ENCOUNTER — Ambulatory Visit: Payer: BLUE CROSS/BLUE SHIELD | Attending: Internal Medicine

## 2019-10-03 ENCOUNTER — Other Ambulatory Visit: Payer: Self-pay | Admitting: Internal Medicine

## 2019-10-03 ENCOUNTER — Other Ambulatory Visit: Payer: Self-pay

## 2019-10-03 DIAGNOSIS — Z20822 Contact with and (suspected) exposure to covid-19: Secondary | ICD-10-CM

## 2019-10-05 LAB — NOVEL CORONAVIRUS, NAA: SARS-CoV-2, NAA: NOT DETECTED

## 2019-10-06 ENCOUNTER — Telehealth: Payer: Self-pay

## 2019-10-06 NOTE — Telephone Encounter (Signed)

## 2019-12-11 ENCOUNTER — Other Ambulatory Visit: Payer: Self-pay | Admitting: Family Medicine

## 2019-12-11 NOTE — Telephone Encounter (Signed)
Sent. Needs f/u with labs at the visit.  Thanks.

## 2019-12-11 NOTE — Telephone Encounter (Signed)
Electronic refill request. Levothyroxine Last TSH:  09/25/18 10/02/2019 Acute - Baity Last Filled:    90 tablet 3 09/29/2018  Please advise.

## 2019-12-11 NOTE — Telephone Encounter (Signed)
Letter mailed

## 2020-03-08 ENCOUNTER — Other Ambulatory Visit: Payer: Self-pay | Admitting: Family Medicine

## 2020-03-10 NOTE — Telephone Encounter (Signed)
Called patient but voicemail box is full and couldn't leave voicemail. Patient still needs to be scheduled for CPE and labs.

## 2020-03-10 NOTE — Telephone Encounter (Signed)
Please call patient and schedule annual physical and lab work. Send back to CMA for refill after appointments are scheduled. 

## 2020-03-11 NOTE — Telephone Encounter (Signed)
Please send back to CMA after appointment is scheduled.

## 2020-03-12 NOTE — Telephone Encounter (Signed)
Called patient and got her voicemail but voicemail box is full so I couldn't leave a message. Will be mailing letter to patient in order to reach out to her.

## 2020-04-09 DIAGNOSIS — M542 Cervicalgia: Secondary | ICD-10-CM | POA: Diagnosis not present

## 2020-04-09 DIAGNOSIS — E039 Hypothyroidism, unspecified: Secondary | ICD-10-CM | POA: Diagnosis not present

## 2020-04-09 DIAGNOSIS — M545 Low back pain: Secondary | ICD-10-CM | POA: Diagnosis not present

## 2020-04-09 DIAGNOSIS — M5442 Lumbago with sciatica, left side: Secondary | ICD-10-CM | POA: Diagnosis not present

## 2020-04-09 DIAGNOSIS — H9192 Unspecified hearing loss, left ear: Secondary | ICD-10-CM | POA: Diagnosis not present

## 2020-04-09 DIAGNOSIS — R202 Paresthesia of skin: Secondary | ICD-10-CM | POA: Diagnosis not present

## 2020-04-09 DIAGNOSIS — M79605 Pain in left leg: Secondary | ICD-10-CM | POA: Diagnosis not present

## 2020-04-09 DIAGNOSIS — M47812 Spondylosis without myelopathy or radiculopathy, cervical region: Secondary | ICD-10-CM | POA: Diagnosis not present

## 2020-04-09 DIAGNOSIS — M1712 Unilateral primary osteoarthritis, left knee: Secondary | ICD-10-CM | POA: Diagnosis not present

## 2020-04-09 DIAGNOSIS — Z8639 Personal history of other endocrine, nutritional and metabolic disease: Secondary | ICD-10-CM | POA: Diagnosis not present

## 2020-04-09 DIAGNOSIS — H6122 Impacted cerumen, left ear: Secondary | ICD-10-CM | POA: Diagnosis not present

## 2020-06-19 NOTE — Telephone Encounter (Signed)
Letter mailed. Routing to close

## 2023-02-15 ENCOUNTER — Ambulatory Visit: Payer: Medicare PPO | Admitting: Podiatry

## 2023-02-15 ENCOUNTER — Encounter: Payer: Self-pay | Admitting: Podiatry

## 2023-02-15 DIAGNOSIS — Q666 Other congenital valgus deformities of feet: Secondary | ICD-10-CM | POA: Diagnosis not present

## 2023-02-15 NOTE — Progress Notes (Signed)
Subjective:  Patient ID: Cassidy Russell, female    DOB: [birth year],  MRN: [id number]  Chief Complaint  Patient presents with   Foot Pain    67 y.o. female presents with the above complaint.  Patient presents with left foot deformity with some arch and heel pain.  Patient states that it has been going for quite some time.  She went to get it evaluated.  She wanted know if she can get in current orthotics she is not a diabetic.  She is doing seated wounds.  She presents.   Review of Systems: Negative except as noted in the HPI. Denies N/V/F/Ch.  Past Medical History:  Diagnosis Date   Arthritis    Bruises easily    Cataracts, bilateral    Diabetes mellitus without complication (HCC)    Elevated BP without diagnosis of hypertension    Headache    Migraines   Heart murmur    lifelong per patient report   Hyperlipidemia    Hypothyroidism    Marijuana use    Metabolic syndrome    Obesity    Obsessive-compulsive behavior    Panic    Postmenopausal vaginal bleeding     Current Outpatient Medications:    ALPRAZolam (XANAX) 1 MG tablet, 1 tab by mouth daily prn anxiety. Use less than two per day, Disp: , Rfl:    fluticasone (FLONASE) 50 MCG/ACT nasal spray, SPRAY 2 SPRAYS INTO EACH NOSTRIL EVERY DAY, Disp: 16 g, Rfl: 11   hydrOXYzine (ATARAX/VISTARIL) 10 MG tablet, Take 1-2 tablets (10-20 mg total) by mouth 3 (three) times daily as needed for anxiety., Disp: 30 tablet, Rfl: 1   levothyroxine (SYNTHROID) 100 MCG tablet, TAKE 1 TABLET BY MOUTH EVERY DAY BEFORE BREAKFAST, Disp: 90 tablet, Rfl: 0   NEOMYCIN-POLYMYXIN-HYDROCORTISONE (CORTISPORIN) 1 % SOLN OTIC solution, Apply 1-2 drops to toe BID after soaking, Disp: 10 mL, Rfl: 1   TURMERIC PO, Take 5 mLs by mouth at bedtime. Pt mixes with 1/4 teaspoon of cinnamon, a cup and a half of soy milk, and 1/2 teaspoon of black pepper., Disp: , Rfl:   Social History   Tobacco Use  Smoking Status Never  Smokeless Tobacco Never    Allergies   Allergen Reactions   Iodine Anaphylaxis and Itching   Shellfish Allergy Anaphylaxis and Itching   Barium-Containing Compounds    Iodinated Contrast Media     Other reaction(s): Unknown   Objective:  There were no vitals filed for this visit. There is no height or weight on file to calculate BMI. Constitutional Well developed. Well nourished.  Vascular Dorsalis pedis pulses palpable bilaterally. Posterior tibial pulses palpable bilaterally. Capillary refill normal to all digits.  No cyanosis or clubbing noted. Pedal hair growth normal.  Neurologic Normal speech. Oriented to person, place, and time. Epicritic sensation to light touch grossly present bilaterally.  Dermatologic Nails well groomed and normal in appearance. No open wounds. No skin lesions.  Orthopedic: Gait examination shows calcaneovalgus with 2 to many toe signs partially removed.  The arch with dorsiflexion of the hallux.  External rotation of the foot noted on the tibia.   Radiographs: None Assessment:   1. Pes planovalgus    Plan:  Patient was evaluated and treated and all questions answered.  Pes planovalgus -I explained to patient the etiology of pes planovalgus and relationship with Planter fasciitis and various treatment options were discussed.  Given patient foot structure in the setting of Planter fasciitis I believe patient will benefit from custom-made orthotics  to help control the hindfoot motion support the arch of the foot and take the stress away from plantar fascial.  Patient agrees with the plan like to proceed with orthotics -Patient was casted for orthotics   No follow-ups on file.

## 2023-03-07 NOTE — Addendum Note (Signed)
Addended by: Nicholes Rough on: 03/07/2023 12:22 PM   Modules accepted: Orders

## 2023-04-06 ENCOUNTER — Ambulatory Visit (INDEPENDENT_AMBULATORY_CARE_PROVIDER_SITE_OTHER): Payer: Medicare PPO | Admitting: Podiatry

## 2023-04-06 DIAGNOSIS — Q666 Other congenital valgus deformities of feet: Secondary | ICD-10-CM

## 2023-04-06 NOTE — Progress Notes (Signed)
Patient presents today to pick up custom molded foot orthotics recommended by Dr. Allena Katz.   Orthotics were dispensed and fit was satisfactory. Reviewed instructions for break-in and wear. Written instructions given to patient.  Patient will follow up as needed.  Advised if she is still having pain over the next couple months to schedule f/u visit with Dr Allena Katz

## 2023-05-24 ENCOUNTER — Ambulatory Visit (INDEPENDENT_AMBULATORY_CARE_PROVIDER_SITE_OTHER): Payer: Medicare PPO | Admitting: Podiatry

## 2023-05-24 DIAGNOSIS — M76822 Posterior tibial tendinitis, left leg: Secondary | ICD-10-CM

## 2023-05-24 DIAGNOSIS — M76821 Posterior tibial tendinitis, right leg: Secondary | ICD-10-CM | POA: Diagnosis not present

## 2023-05-24 DIAGNOSIS — Q666 Other congenital valgus deformities of feet: Secondary | ICD-10-CM

## 2023-05-25 NOTE — Progress Notes (Signed)
  Subjective:  Patient ID: Cassidy Russell, female    DOB: October 22, 1955,  MRN: [id number]  Chief Complaint  Patient presents with   Foot Pain    Pt stiil pain and she stated she don't think that the orthotics are helping at all.    67 y.o. female presents with the above complaint. History confirmed with patient.  She returns for follow-up she feels that the orthotics do not fit there to narrow causing pressure on her toes and causing him to curl up she also feels that taking into the side of her feet if she wears a double wide and this is quite narrow.  Objective:  Physical Exam: warm, good capillary refill, no trophic changes or ulcerative lesions, normal DP and PT pulses, normal sensory exam, and severe pes planovalgus forming with pain in the sinus tarsi and medial ankle and arch bilateral.  Assessment:   1. Pes planovalgus   2. Posterior tibial tendon dysfunction (PTTD) of both lower extremities      Plan:  Patient was evaluated and treated and all questions answered.  I inspected her orthotics as well as her foot today, I recommended her orthotics be sent back for adjustment to increase the ground with, apply a scaphoid pad and metatarsal pad bilaterally as well as a medial flange to prevent further valgus of the foot.  If not improving we will plan to take x-rays and consider further advanced imaging for surgical consideration  Return in about 5 weeks (around 06/28/2023) for f/u orthotics adjustment, xrays if painful still .

## 2023-06-07 ENCOUNTER — Telehealth: Payer: Self-pay | Admitting: Family Medicine

## 2023-06-07 NOTE — Telephone Encounter (Signed)
Patient last seen in 2021- called patient to schedule an appointment, unable to leave voicemail

## 2023-06-23 ENCOUNTER — Ambulatory Visit: Payer: Medicare PPO

## 2023-06-23 NOTE — Progress Notes (Signed)
Remake was fit today patient states they feel ok will wear for couple weeks and will call if any problems arise  Patient was instructed on wear care and break in  Addison Bailey Cped, CFo, CFm

## 2023-06-27 ENCOUNTER — Ambulatory Visit: Payer: Medicare PPO | Admitting: Podiatry

## 2023-07-11 ENCOUNTER — Ambulatory Visit: Payer: Medicare PPO | Admitting: Podiatry

## 2023-07-11 ENCOUNTER — Ambulatory Visit (INDEPENDENT_AMBULATORY_CARE_PROVIDER_SITE_OTHER): Payer: Medicare PPO

## 2023-07-11 ENCOUNTER — Encounter: Payer: Self-pay | Admitting: Podiatry

## 2023-07-11 VITALS — BP 142/80 | HR 82 | Temp 97.8°F | Resp 18 | Ht 62.5 in | Wt 276.0 lb

## 2023-07-11 DIAGNOSIS — M85671 Other cyst of bone, right ankle and foot: Secondary | ICD-10-CM

## 2023-07-11 DIAGNOSIS — M6740 Ganglion, unspecified site: Secondary | ICD-10-CM | POA: Diagnosis not present

## 2023-07-11 NOTE — Progress Notes (Signed)
Subjective:   Patient ID: Cassidy Russell, female   DOB: 67 y.o.   MRN: [id number]   HPI Patient presents with a large what appears to be cyst on the plantar lateral aspect of the right hallux measuring about 2 cm x 2 cm that is becoming painful and is very recent of several weeks duration   ROS      Objective:  Physical Exam  Neurovascular status intact with patient's right foot showing enlarged nodule which most likely is fluid given that its only been present for several weeks     Assessment:  Ability for ganglionic cyst plantar aspect right     Plan:  Reviewed condition and x-ray did a proximal nerve block of the right hallux aspirated the area getting out gelatinous fluid consistent with ganglionic applied compression that I want her to try to utilize for the next 3 to 4 weeks and reappoint as symptoms indicate  X-rays were negative for signs that this is a fracture does have quite a bit of arthritis in the big toe joint right

## 2023-07-19 ENCOUNTER — Ambulatory Visit: Payer: Medicare PPO | Admitting: Podiatry

## 2023-07-19 ENCOUNTER — Encounter: Payer: Self-pay | Admitting: Podiatry

## 2023-07-19 DIAGNOSIS — M76821 Posterior tibial tendinitis, right leg: Secondary | ICD-10-CM | POA: Diagnosis not present

## 2023-07-19 DIAGNOSIS — Q666 Other congenital valgus deformities of feet: Secondary | ICD-10-CM | POA: Diagnosis not present

## 2023-07-19 DIAGNOSIS — M6740 Ganglion, unspecified site: Secondary | ICD-10-CM | POA: Diagnosis not present

## 2023-07-19 DIAGNOSIS — M76822 Posterior tibial tendinitis, left leg: Secondary | ICD-10-CM | POA: Diagnosis not present

## 2023-07-19 NOTE — Progress Notes (Signed)
  Subjective:  Patient ID: Cassidy Russell, female    DOB: 25-Mar-1956,  MRN: [id number]  Chief Complaint  Patient presents with   Foot Orthotics    Problem with orthotics fit for flat foot. Also recently seen for ganglion right great toe which is still quite prominent    67 y.o. female presents with the above complaint. History confirmed with patient.  She returns for follow-up the orthotics are feeling better not having pain in the foot, she does feel like she slides forward in the orthotic little bit.  She does have a new issue of a mass or cyst on the right great toe, she saw Dr. Charlsie Merles for this and drainage was attempted but could not get much out  Objective:  Physical Exam: warm, good capillary refill, no trophic changes or ulcerative lesions, normal DP and PT pulses, normal sensory exam, and severe pes planovalgus, no pain to palpation or manipulation of joints today, she has a palpable soft tissue mass at the distal pulp of the right hallux  Assessment:   1. Ganglion   2. Posterior tibial tendon dysfunction (PTTD) of both lower extremities   3. Pes planovalgus      Plan:  Patient was evaluated and treated and all questions answered.  Her orthotics are doing fairly well, she is going to try to see if she can cushion the front of her shoe so the foot does not slide forward I do not think that adjusting the arch will change this.  Also could trim the front of the orthosis some so that the orthotics it is more forward in her shoe.  She has a new issue of a ganglion cyst and/or mass on the right hallux distal pulp.  Aspiration was attempted but was unsuccessful.  I recommend an MRI to evaluate further.  This has been ordered and I will see her back after the MRI for further treatment options including possible repeat aspiration and injection of steroid.  Or potential surgical planning  No follow-ups on file.

## 2023-07-19 NOTE — Patient Instructions (Signed)
Call [facility] to schedule your MRI at the below locations.  Please allow at least 1 business day after your visit to process the referral.  It may take longer depending on approval from insurance.  Please let me know if you have issues or problems scheduling the MRI   [facility] [phone] [address] [place], [place] [address]  [facility] [phone] [address] [place], [place] [address]  

## 2023-08-09 ENCOUNTER — Encounter: Payer: Self-pay | Admitting: Podiatry

## 2023-08-11 ENCOUNTER — Other Ambulatory Visit: Payer: BLUE CROSS/BLUE SHIELD

## 2023-08-27 ENCOUNTER — Other Ambulatory Visit: Payer: BLUE CROSS/BLUE SHIELD

## 2023-10-11 DIAGNOSIS — H2513 Age-related nuclear cataract, bilateral: Secondary | ICD-10-CM | POA: Diagnosis not present

## 2023-10-11 DIAGNOSIS — Z0100 Encounter for examination of eyes and vision without abnormal findings: Secondary | ICD-10-CM | POA: Diagnosis not present

## 2023-10-11 DIAGNOSIS — H43813 Vitreous degeneration, bilateral: Secondary | ICD-10-CM | POA: Diagnosis not present

## 2023-11-21 DIAGNOSIS — Z136 Encounter for screening for cardiovascular disorders: Secondary | ICD-10-CM | POA: Diagnosis not present

## 2023-11-21 DIAGNOSIS — E039 Hypothyroidism, unspecified: Secondary | ICD-10-CM | POA: Diagnosis not present

## 2023-11-21 DIAGNOSIS — R7303 Prediabetes: Secondary | ICD-10-CM | POA: Diagnosis not present

## 2023-11-21 DIAGNOSIS — Z0001 Encounter for general adult medical examination with abnormal findings: Secondary | ICD-10-CM | POA: Diagnosis not present

## 2023-11-21 DIAGNOSIS — Z1211 Encounter for screening for malignant neoplasm of colon: Secondary | ICD-10-CM | POA: Diagnosis not present

## 2023-11-21 DIAGNOSIS — Z79899 Other long term (current) drug therapy: Secondary | ICD-10-CM | POA: Diagnosis not present

## 2023-11-21 DIAGNOSIS — F410 Panic disorder [episodic paroxysmal anxiety] without agoraphobia: Secondary | ICD-10-CM | POA: Diagnosis not present

## 2023-12-16 DIAGNOSIS — E039 Hypothyroidism, unspecified: Secondary | ICD-10-CM | POA: Diagnosis not present

## 2023-12-16 DIAGNOSIS — Z79899 Other long term (current) drug therapy: Secondary | ICD-10-CM | POA: Diagnosis not present

## 2023-12-20 DIAGNOSIS — E119 Type 2 diabetes mellitus without complications: Secondary | ICD-10-CM | POA: Diagnosis not present

## 2023-12-20 DIAGNOSIS — M25561 Pain in right knee: Secondary | ICD-10-CM | POA: Diagnosis not present

## 2023-12-29 DIAGNOSIS — M25561 Pain in right knee: Secondary | ICD-10-CM | POA: Diagnosis not present

## 2023-12-29 DIAGNOSIS — M2141 Flat foot [pes planus] (acquired), right foot: Secondary | ICD-10-CM | POA: Diagnosis not present

## 2023-12-29 DIAGNOSIS — M2142 Flat foot [pes planus] (acquired), left foot: Secondary | ICD-10-CM | POA: Diagnosis not present

## 2024-01-05 DIAGNOSIS — H04123 Dry eye syndrome of bilateral lacrimal glands: Secondary | ICD-10-CM | POA: Diagnosis not present

## 2024-01-05 DIAGNOSIS — H0289 Other specified disorders of eyelid: Secondary | ICD-10-CM | POA: Diagnosis not present

## 2024-01-05 DIAGNOSIS — H1132 Conjunctival hemorrhage, left eye: Secondary | ICD-10-CM | POA: Diagnosis not present

## 2024-01-17 DIAGNOSIS — Z6841 Body Mass Index (BMI) 40.0 and over, adult: Secondary | ICD-10-CM | POA: Diagnosis not present

## 2024-01-17 DIAGNOSIS — E119 Type 2 diabetes mellitus without complications: Secondary | ICD-10-CM | POA: Diagnosis not present

## 2024-01-23 DIAGNOSIS — N644 Mastodynia: Secondary | ICD-10-CM | POA: Diagnosis not present

## 2024-03-21 DIAGNOSIS — K5900 Constipation, unspecified: Secondary | ICD-10-CM | POA: Diagnosis not present

## 2024-03-21 DIAGNOSIS — Z1211 Encounter for screening for malignant neoplasm of colon: Secondary | ICD-10-CM | POA: Diagnosis not present

## 2024-04-12 DIAGNOSIS — E039 Hypothyroidism, unspecified: Secondary | ICD-10-CM | POA: Diagnosis not present

## 2024-04-12 DIAGNOSIS — E119 Type 2 diabetes mellitus without complications: Secondary | ICD-10-CM | POA: Diagnosis not present

## 2024-04-17 ENCOUNTER — Other Ambulatory Visit (HOSPITAL_COMMUNITY): Payer: Self-pay | Admitting: Family Medicine

## 2024-04-17 DIAGNOSIS — E7800 Pure hypercholesterolemia, unspecified: Secondary | ICD-10-CM | POA: Diagnosis not present

## 2024-04-17 DIAGNOSIS — M79672 Pain in left foot: Secondary | ICD-10-CM | POA: Diagnosis not present

## 2024-04-17 DIAGNOSIS — E039 Hypothyroidism, unspecified: Secondary | ICD-10-CM | POA: Diagnosis not present

## 2024-04-17 DIAGNOSIS — E119 Type 2 diabetes mellitus without complications: Secondary | ICD-10-CM | POA: Diagnosis not present

## 2024-04-17 DIAGNOSIS — M25561 Pain in right knee: Secondary | ICD-10-CM | POA: Diagnosis not present

## 2024-04-17 DIAGNOSIS — M25562 Pain in left knee: Secondary | ICD-10-CM | POA: Diagnosis not present

## 2024-04-17 DIAGNOSIS — M79671 Pain in right foot: Secondary | ICD-10-CM | POA: Diagnosis not present

## 2024-04-17 DIAGNOSIS — G8929 Other chronic pain: Secondary | ICD-10-CM | POA: Diagnosis not present

## 2024-04-25 ENCOUNTER — Other Ambulatory Visit

## 2024-05-04 ENCOUNTER — Ambulatory Visit
Admission: RE | Admit: 2024-05-04 | Discharge: 2024-05-04 | Disposition: A | Discharge status: Home / Self Care | Payer: Self-pay | Source: Ambulatory Visit | Attending: Family Medicine | Admitting: Family Medicine

## 2024-05-04 DIAGNOSIS — E7800 Pure hypercholesterolemia, unspecified: Secondary | ICD-10-CM | POA: Insufficient documentation

## 2024-08-09 ENCOUNTER — Other Ambulatory Visit (INDEPENDENT_AMBULATORY_CARE_PROVIDER_SITE_OTHER): Payer: Self-pay

## 2024-08-09 ENCOUNTER — Ambulatory Visit: Admitting: Orthopedic Surgery

## 2024-08-09 DIAGNOSIS — M79671 Pain in right foot: Secondary | ICD-10-CM

## 2024-08-09 DIAGNOSIS — M79672 Pain in left foot: Secondary | ICD-10-CM

## 2024-08-12 ENCOUNTER — Encounter: Payer: Self-pay | Admitting: Orthopedic Surgery

## 2024-08-12 NOTE — Progress Notes (Signed)
 Office Visit Note   Patient: Cassidy Russell           Date of Birth: 06-Aug-1956           MRN: [id number] Visit Date: 08/09/2024              Requested by: Regino Slater, MD [address] [place],  [place] [address] PCP: Regino Slater, MD  Chief Complaint  Patient presents with   Right Foot - Pain   Left Foot - Pain      HPI: Discussed the use of AI scribe software for clinical note transcription with the patient, who gave verbal consent to proceed.  History of Present Illness Cassidy Russell is a 68 year old female who presents with bilateral foot pain and hallux rigidus.  She experiences chronic bilateral foot pain, primarily affecting the great toes, which is exacerbated by walking and standing. This pain limits her ability to walk long distances and stand for extended periods. She describes the pain as excruciating when pressure is applied, particularly when wearing shoes. Orthotic inserts for pes planus have not alleviated her symptoms.  She has a history of back pain and a ganglion cyst on the right great toe. She experiences sciatic pain in the left calf at rest, described as a 'hot poker' sensation occurring at night.  She is prediabetic and monitors her fluid intake to manage symptoms. She has varicose veins causing swelling and uses compression socks. She reports a purple discoloration of the toenail, attributed to toenail fungus.  [place] is retired and [place], which allows her to manage her activities according to her comfort level.     Assessment & Plan: Visit Diagnoses:  1. Bilateral foot pain     Plan: Assessment and Plan Assessment & Plan Bilateral hallux rigidus with degenerative arthritis of great toe MTP joints Chronic bilateral foot pain with significant hallux rigidus and degenerative arthritis. Radiographs show joint space collapse and osteophytes. Conservative treatment ineffective. - Recommend larger, stiff New Balance walking  sneakers for conservative treatment. - Consider surgical fusion of great toe MTP joint if conservative treatment fails. Post-op requires non-weight bearing for four weeks using kneeling scooter or wheelchair.  Bilateral pes planus (flat feet) Chronic bilateral pes planus with foot pain. Orthotics ineffective. No surgical intervention as bone alignment is normal. - Discontinue orthotics if ineffective.  Left lower extremity sciatica Intermittent left lower extremity sciatic pain, likely related to back issues. No surgical intervention indicated. - Encourage weight loss, diet modification, and exercise for symptom management.      Follow-Up Instructions: No follow-ups on file.   Ortho Exam  Patient is alert, oriented, no adenopathy, well-dressed, normal affect, normal respiratory effort. Physical Exam EXTREMITIES: Good dorsiflexion of both ankles. Good pulses on both feet. No range of motion of right great toe MTP joint. 10 degrees range of motion of left great toe MTP joint. Callus on big toe of both feet. Callus over great toe from increased pressure.      Imaging: No results found. No images are attached to the encounter.  Labs: Lab Results  Component Value Date   HGBA1C 6.6 (H) 09/25/2018   HGBA1C 6.8 (H) 05/19/2018   HGBA1C 6.9 (H) 04/14/2017     Lab Results  Component Value Date   ALBUMIN 4.1 05/19/2018   ALBUMIN 4.0 04/14/2017    No results found for: MG No results found for: VD25OH  No results found for: PREALBUMIN    Latest Ref  Rng & Units 01/02/2018    4:20 PM 10/12/2016   10:55 AM  CBC EXTENDED  WBC 3.6 - 11.0 K/uL 7.3  7.6   RBC 3.80 - 5.20 MIL/uL 4.72  4.63   Hemoglobin 12.0 - 16.0 g/dL 84.7  84.6   HCT 64.9 - 47.0 % 46.2  45.7   Platelets 150 - 440 K/uL 239  221      There is no height or weight on file to calculate BMI.  Orders:  Orders Placed This Encounter  Procedures   XR Foot Complete Left   XR Foot Complete Right   No orders  of the defined types were placed in this encounter.    Procedures: No procedures performed  Clinical Data: No additional findings.  ROS:  All other systems negative, except as noted in the HPI. Review of Systems  Objective: Vital Signs: There were no vitals taken for this visit.  Specialty Comments:  No specialty comments available.  PMFS History: Patient Active Problem List   Diagnosis Date Noted   Chest pain 01/06/2018   Neck mass 04/15/2017   Healthcare maintenance 08/11/2016   Postmenopausal vaginal bleeding    Panic    Obesity    Metabolic syndrome    Hypothyroidism    Hyperlipidemia    Heart murmur    Elevated BP without diagnosis of hypertension    Diabetes mellitus without complication (HCC)    Arthritis    Past Medical History:  Diagnosis Date   Arthritis    Bruises easily    Cataracts, bilateral    Diabetes mellitus without complication (HCC)    Elevated BP without diagnosis of hypertension    Headache    Migraines   Heart murmur    lifelong per patient report   Hyperlipidemia    Hypothyroidism    Marijuana use    Metabolic syndrome    Obesity    Obsessive-compulsive behavior    Panic    Postmenopausal vaginal bleeding     Family History  Adopted: Yes    Past Surgical History:  Procedure Laterality Date   COLONOSCOPY     DILATATION & CURETTAGE/HYSTEROSCOPY WITH MYOSURE N/A 10/19/2016   Procedure: DILATATION & CURETTAGE/HYSTEROSCOPY WITH MYOSURE;  Surgeon: Charlie Flowers, MD;  Location: [facility];  Service: Gynecology;  Laterality: N/A;   NO PAST SURGERIES     Social History   Occupational History   Not on file  Tobacco Use   Smoking status: Never   Smokeless tobacco: Never  Substance and Sexual Activity   Alcohol use: Yes    Alcohol/week: 2.0 standard drinks of alcohol    Types: 2 Cans of beer per week    Comment: 1 a day   Drug use: Yes    Types: Marijuana    Comment: occ. last time 10/03/2016   Sexual activity: Not on file

## 2024-08-15 DIAGNOSIS — Z79899 Other long term (current) drug therapy: Secondary | ICD-10-CM | POA: Diagnosis not present

## 2024-08-15 DIAGNOSIS — E039 Hypothyroidism, unspecified: Secondary | ICD-10-CM | POA: Diagnosis not present

## 2024-08-15 DIAGNOSIS — E119 Type 2 diabetes mellitus without complications: Secondary | ICD-10-CM | POA: Diagnosis not present

## 2024-08-20 DIAGNOSIS — E119 Type 2 diabetes mellitus without complications: Secondary | ICD-10-CM | POA: Diagnosis not present

## 2024-08-20 DIAGNOSIS — Z79899 Other long term (current) drug therapy: Secondary | ICD-10-CM | POA: Diagnosis not present

## 2024-08-20 DIAGNOSIS — L309 Dermatitis, unspecified: Secondary | ICD-10-CM | POA: Diagnosis not present

## 2024-08-20 DIAGNOSIS — E039 Hypothyroidism, unspecified: Secondary | ICD-10-CM | POA: Diagnosis not present

## 2024-08-20 DIAGNOSIS — E7800 Pure hypercholesterolemia, unspecified: Secondary | ICD-10-CM | POA: Diagnosis not present
